# Patient Record
Sex: Female | Born: 1960
Health system: Southern US, Community
[De-identification: ages and names within clinical notes are randomized; demographics above are authoritative.]

## PROBLEM LIST (undated history)

## (undated) DIAGNOSIS — I10 Essential (primary) hypertension: Secondary | ICD-10-CM

## (undated) DIAGNOSIS — O009 Unspecified ectopic pregnancy without intrauterine pregnancy: Secondary | ICD-10-CM

## (undated) DIAGNOSIS — J45909 Unspecified asthma, uncomplicated: Secondary | ICD-10-CM

## (undated) DIAGNOSIS — K219 Gastro-esophageal reflux disease without esophagitis: Secondary | ICD-10-CM

## (undated) HISTORY — PX: SALPINGECTOMY: SHX328

## (undated) HISTORY — PX: ECTOPIC PREGNANCY SURGERY: SHX613

---

## 1998-11-11 DIAGNOSIS — D259 Leiomyoma of uterus, unspecified: Secondary | ICD-10-CM | POA: Insufficient documentation

## 2002-08-20 ENCOUNTER — Encounter (INDEPENDENT_AMBULATORY_CARE_PROVIDER_SITE_OTHER): Payer: Self-pay | Admitting: Internal Medicine

## 2007-02-17 ENCOUNTER — Emergency Department (HOSPITAL_COMMUNITY): Admission: EM | Admit: 2007-02-17 | Discharge: 2007-02-17 | Payer: Self-pay | Admitting: Emergency Medicine

## 2008-03-30 ENCOUNTER — Ambulatory Visit (HOSPITAL_COMMUNITY): Admission: RE | Admit: 2008-03-30 | Discharge: 2008-03-30 | Payer: Self-pay | Admitting: Obstetrics & Gynecology

## 2009-02-07 DIAGNOSIS — R87610 Atypical squamous cells of undetermined significance on cytologic smear of cervix (ASC-US): Secondary | ICD-10-CM | POA: Insufficient documentation

## 2009-06-06 ENCOUNTER — Ambulatory Visit (HOSPITAL_COMMUNITY): Admission: RE | Admit: 2009-06-06 | Discharge: 2009-06-06 | Payer: Self-pay | Admitting: Family Medicine

## 2009-11-21 ENCOUNTER — Emergency Department (HOSPITAL_COMMUNITY): Admission: EM | Admit: 2009-11-21 | Discharge: 2009-11-21 | Payer: Self-pay | Admitting: Emergency Medicine

## 2010-02-17 ENCOUNTER — Ambulatory Visit: Payer: Self-pay | Admitting: Internal Medicine

## 2010-02-17 DIAGNOSIS — J45909 Unspecified asthma, uncomplicated: Secondary | ICD-10-CM | POA: Insufficient documentation

## 2010-02-17 DIAGNOSIS — I8390 Asymptomatic varicose veins of unspecified lower extremity: Secondary | ICD-10-CM | POA: Insufficient documentation

## 2010-02-17 LAB — CONVERTED CEMR LAB
ALT: 11 units/L (ref 0–35)
Albumin: 4.5 g/dL (ref 3.5–5.2)
Basophils Absolute: 0 10*3/uL (ref 0.0–0.1)
Basophils Relative: 0 % (ref 0–1)
Calcium: 9.6 mg/dL (ref 8.4–10.5)
Chloride: 105 meq/L (ref 96–112)
Cholesterol: 184 mg/dL (ref 0–200)
Eosinophils Absolute: 0.1 10*3/uL (ref 0.0–0.7)
Eosinophils Relative: 3 % (ref 0–5)
Glucose, Bld: 80 mg/dL (ref 70–99)
Lymphocytes Relative: 43 % (ref 12–46)
Lymphs Abs: 2.2 10*3/uL (ref 0.7–4.0)
MCHC: 34.5 g/dL (ref 30.0–36.0)
Monocytes Relative: 6 % (ref 3–12)
Neutrophils Relative %: 48 % (ref 43–77)
Potassium: 4.4 meq/L (ref 3.5–5.3)
RBC: 4.4 M/uL (ref 3.87–5.11)
RDW: 12.5 % (ref 11.5–15.5)
Sodium: 140 meq/L (ref 135–145)
WBC: 5.2 10*3/uL (ref 4.0–10.5)

## 2010-02-21 ENCOUNTER — Encounter: Payer: Self-pay | Admitting: Physician Assistant

## 2010-03-02 ENCOUNTER — Encounter (INDEPENDENT_AMBULATORY_CARE_PROVIDER_SITE_OTHER): Payer: Self-pay | Admitting: Internal Medicine

## 2010-03-08 ENCOUNTER — Encounter (INDEPENDENT_AMBULATORY_CARE_PROVIDER_SITE_OTHER): Payer: Self-pay | Admitting: Internal Medicine

## 2010-07-07 ENCOUNTER — Ambulatory Visit
Admission: RE | Admit: 2010-07-07 | Discharge: 2010-07-07 | Payer: Self-pay | Source: Home / Self Care | Attending: Internal Medicine | Admitting: Internal Medicine

## 2010-07-07 ENCOUNTER — Encounter (INDEPENDENT_AMBULATORY_CARE_PROVIDER_SITE_OTHER): Payer: Self-pay | Admitting: Internal Medicine

## 2010-07-07 ENCOUNTER — Other Ambulatory Visit: Payer: Self-pay | Admitting: Internal Medicine

## 2010-07-07 ENCOUNTER — Other Ambulatory Visit (HOSPITAL_COMMUNITY): Payer: Self-pay | Admitting: Internal Medicine

## 2010-07-07 DIAGNOSIS — G4700 Insomnia, unspecified: Secondary | ICD-10-CM | POA: Insufficient documentation

## 2010-07-07 DIAGNOSIS — Z139 Encounter for screening, unspecified: Secondary | ICD-10-CM

## 2010-07-07 DIAGNOSIS — Z1231 Encounter for screening mammogram for malignant neoplasm of breast: Secondary | ICD-10-CM

## 2010-07-07 LAB — CONVERTED CEMR LAB
Chlamydia, DNA Probe: NEGATIVE
Glucose, Urine, Semiquant: NEGATIVE
KOH Prep: NEGATIVE
Specific Gravity, Urine: 1.025
Urobilinogen, UA: 0.2
pH: 5.5

## 2010-07-11 ENCOUNTER — Ambulatory Visit (HOSPITAL_COMMUNITY): Admission: RE | Admit: 2010-07-11 | Payer: Self-pay | Source: Home / Self Care | Admitting: Internal Medicine

## 2010-07-11 NOTE — Letter (Signed)
Summary: Lipid Letter  [facility]  [address] [place], [place] [address]   Phone: [phone]  Fax: [phone]    03/08/2010  Brianna Jenkins [address] [place], [place]  [address]  Dear Marton Redwood:  We have carefully reviewed your last lipid profile from 02/17/2010 and the results are noted below with a summary of recommendations for lipid management.    Cholesterol:       184     Goal: <200   HDL "good" Cholesterol:   60     Goal: >45   LDL "bad" Cholesterol:   102     Goal: <100   Triglycerides:       110     Goal: <150    Cholesterol and rest of labs were fine.    TLC Diet (Therapeutic Lifestyle Change): Saturated Fats & Transfatty acids should be kept < 7% of total calories ***Reduce Saturated Fats Polyunstaurated Fat can be up to 10% of total calories Monounsaturated Fat Fat can be up to 20% of total calories Total Fat should be no greater than 25-35% of total calories Carbohydrates should be 50-60% of total calories Protein should be approximately 15% of total calories Fiber should be at least 20-30 grams a day ***Increased fiber may help lower LDL Total Cholesterol should be < 200mg /day Consider adding plant stanol/sterols to diet (example: Benacol spread) ***A higher intake of unsaturated fat may reduce Triglycerides and Increase HDL    Adjunctive Measures (may lower LIPIDS and reduce risk of Heart Attack) include: Aerobic Exercise (20-30 minutes 3-4 times a week) Limit Alcohol Consumption Weight Reduction Aspirin 75-81 mg a day by mouth (if not allergic or contraindicated) Dietary Fiber 20-30 grams a day by mouth     Current Medications: 1)    Advair Diskus 250-50 Mcg/dose Aepb (Fluticasone-salmeterol) .Marland Kitchen.. 1 inhalation two times a day 2)    Ventolin Hfa 108 (90 Base) Mcg/act Aers (Albuterol sulfate) .... 2 puffs every 4 hours as needed for wheezing.  If you have any questions, please call. We appreciate  being able to work with you.   Sincerely,    [facility]

## 2010-07-11 NOTE — Assessment & Plan Note (Signed)
Summary: NEW/ESTABLISH CARE////RJE   Vital Signs:  Patient profile:   50 year old female Menstrual status:  regular LMP:     01/27/2010 Height:      67 inches Weight:      157 pounds BMI:     24.68 O2 Sat:      97 % on Room air Temp:     98.1 degrees F oral Pulse rate:   92 / minute Pulse rhythm:   regular Resp:     20 per minute BP sitting:   122 / 84  (left arm) Cuff size:   regular  Vitals Entered By: Michelle Nasuti (February 17, 2010 11:04 AM)  O2 Flow:  Room air  Serial Vital Signs/Assessments:  Comments: peak flows 1) 240 2) 250 3) 260 By: Michelle Nasuti   CC: establish care Pain Assessment Patient in pain? no       Does patient need assistance? Functional Status Self care Ambulation Normal LMP (date): 01/27/2010     Menstrual Status regular Enter LMP: 01/27/2010   CC:  establish care.  History of Present Illness: 50 yo female here to establish.  Really just wants to get established with a primary.    Concerns:  1. Varicose veins:  Lot in legs--getting larger and at times painful.  Has never worn compression stockings.  Not working and not on feet excessively.  Occasionally has leg swelling with this.  2.  Hx of abnormal pap 1 year ago through [facility] screen.  HPV was negative.  When was in 20's  had an abnormal pap.  Biopsy was normal.  Paps ultimately normalized as well.  3.  Asthma:  Diagnosed in early 16s. Currently treated with Advair 250/50 only as needed, then uses the Albuterol if does not get the relief. This past week, using both meds every day.  Has problems generally when seasons change.    Habits & Providers  Alcohol-Tobacco-Diet     Tobacco Status: current     Cigarette Packs/Day: 0.5  Current Medications (verified): 1)  Ventolin Hfa 108 (90 Base) Mcg/act Aers (Albuterol Sulfate) 2)  Advair Diskus 250-50 Mcg/dose Aepb (Fluticasone-Salmeterol)  Allergies (verified): No Known Drug Allergies  Past History:  Past  Surgical History: 1.   1988, 1989, 1994: separate surgeries for ectopic pregnancies.  Only left salpingectomy for removal of her tissue.  Last was laparoscopic  Family History: Mother, 22:  DM, hypertension. Father, 67:  unknown. 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  Currently 1ppd at most Alcohol:  Occasional Drug use:  never.Smoking Status:  current Packs/Day:  0.5  Physical Exam  Eyes:  No injection or watering Mouth:  pharynx pink and moist.   Neck:  No deformities, masses, or tenderness noted.  No thyroid enlargement--just excess tissue anterior to neck Lungs:  Rare wheeze with good air movement. Heart:  Normal rate and regular rhythm. S1 and S2 normal without gallop, murmur, click, rub or other extra sounds.  Radial pulses normal and equal Extremities:  Pt. could not get pant leg up to visualize and would like to wait until seen for CPP   Impression & Recommendations:  Problem # 1:  VARICOSE VEINS, LOWER EXTREMITIES (ICD-454.9) Elevation, compression stockings, avoid standing in place Orders: T-Comprehensive Metabolic Panel ([id number]) T-Lipid Profile (  [phone]) T-CBC w/Diff [phone])  Problem # 2:  ASTHMA (ICD-493.90) Discussed proper use of Advair on regular basis to prevent wheezing. Encouraged flu shot when comes available. Her updated medication list for this problem includes:    Advair Diskus 250-50 Mcg/dose Aepb (Fluticasone-salmeterol) .Marland Kitchen... 1 inhalation two times a day    Ventolin Hfa 108 (90 Base) Mcg/act Aers (Albuterol sulfate) .Marland Kitchen... 2 puffs every 4 hours as needed for wheezing.  Orders: T-Comprehensive Metabolic Panel [id number]) T-Lipid Profile  [phone]) T-CBC w/Diff ([id number])  Problem # 3:  PAP SMEAR, ABNORMAL (ICD-795.00) CPP next available.  Complete Medication List: 1)  Advair Diskus 250-50 Mcg/dose Aepb (Fluticasone-salmeterol) .Marland Kitchen.. 1 inhalation two times a day 2)  Ventolin Hfa 108 (90 Base) Mcg/act Aers (Albuterol sulfate) .... 2 puffs every 4 hours as needed for wheezing.  Other Orders: T-TSH [phone])  Patient Instructions: 1)  CPP next available with Dr. Delrae Alfred Prescriptions: VENTOLIN HFA 108 (90 BASE) MCG/ACT AERS (ALBUTEROL SULFATE) 2 puffs every 4 hours as needed for wheezing.  #1 x 1   Entered and Authorized by:   Julieanne Manson MD   Signed by:   Julieanne Manson MD on 02/17/2010   Method used:   Faxed to ...       [facility] (retail)       [address] [place], [place]  [address]       Ph: [phone]       Fax: [phone]   RxID:   [id number] ADVAIR DISKUS 250-50 MCG/DOSE AEPB (FLUTICASONE-SALMETEROL) 1 inhalation two times a day  #1 x 11   Entered and Authorized by:   Julieanne Manson MD   Signed by:   Julieanne Manson MD on 02/17/2010   Method used:   Faxed to ...       [facility] (retail)       [address] [place], [place]  [address]       Ph: [phone]       Fax: [phone]   RxID:   [id number]

## 2010-07-11 NOTE — Letter (Signed)
Summary: PT INFORMATION SHEET  PT INFORMATION SHEET   Imported By: Arta Bruce 02/17/2010 12:43:59  _____________________________________________________________________  External Attachment:    Type:   Image     Comment:   External Document

## 2010-07-11 NOTE — Letter (Signed)
Summary: REQUESTING RECORDS FROM [facility]  REQUESTING RECORDS FROM TINA DOBSERGAVE   Imported By: Arta Bruce 02/22/2010 12:44:24  _____________________________________________________________________  External Attachment:    Type:   Image     Comment:   External Document

## 2010-07-14 ENCOUNTER — Inpatient Hospital Stay (HOSPITAL_COMMUNITY): Admission: RE | Admit: 2010-07-14 | Payer: Self-pay | Source: Ambulatory Visit

## 2010-07-15 ENCOUNTER — Encounter: Payer: Self-pay | Admitting: Internal Medicine

## 2010-07-22 ENCOUNTER — Telehealth (INDEPENDENT_AMBULATORY_CARE_PROVIDER_SITE_OTHER): Payer: Self-pay | Admitting: Internal Medicine

## 2010-07-22 ENCOUNTER — Encounter (INDEPENDENT_AMBULATORY_CARE_PROVIDER_SITE_OTHER): Payer: Self-pay | Admitting: Internal Medicine

## 2010-07-25 ENCOUNTER — Encounter (INDEPENDENT_AMBULATORY_CARE_PROVIDER_SITE_OTHER): Payer: Self-pay | Admitting: Internal Medicine

## 2010-07-27 NOTE — Letter (Signed)
Summary: *HSN Results Follow up  [facility]  [address] [place], [place] [address]   Phone: [phone]  Fax: [phone]      07/22/2010   Brianna Jenkins [address] [place], [place]  [address]   Dear  Ms. [employer],                            ____S.Drinkard,FNP   ____D. Gore,FNP       ____B. McPherson,MD   ____V. Rankins,MD    __X__E. Derrica Sieg,MD    ____N. Daphine Deutscher, FNP  ____D. Reche Dixon, MD    ____K. Philipp Deputy, MD    ____Other     This letter is to inform you that your recent test(s):  ____X___Pap Smear    ___X___Lab Test     _______X-ray    ____X_ is within acceptable limits  _______ requires a medication change  _______ requires a follow-up lab visit  _______ requires a follow-up visit with your provider   Comments:  Your pap and STD testing was fine.  I am calling the pathology lab to get a copy of your previous pap to be sure this is all the follow up that should be done.       _________________________________________________________ If you have any questions, please contact our office                     Sincerely,  Julieanne Manson MD [facility]

## 2010-08-08 ENCOUNTER — Ambulatory Visit (HOSPITAL_COMMUNITY): Payer: Self-pay | Attending: Internal Medicine

## 2010-08-08 NOTE — Letter (Signed)
Summary: PATHOLOGY  PATHOLOGY   Imported By: Arta Bruce 08/03/2010 13:42:52  _____________________________________________________________________  External Attachment:    Type:   Image     Comment:   External Document

## 2010-08-08 NOTE — Assessment & Plan Note (Signed)
Summary: FU FOR CPP///KT   Vital Signs:  Patient profile:   50 year old female Menstrual status:  regular LMP:     07/03/2010 Height:      67 inches Weight:      158.06 pounds O2 Sat:      96 % on Room air Temp:     97 degrees F oral Pulse rate:   90 / minute Pulse rhythm:   regular Resp:     18 per minute BP sitting:   110 / 78  (left arm) Cuff size:   regular  Vitals Entered By: Hale Drone CMA (July 07, 2010 12:00 PM)  O2 Flow:  Room air CC: 50 y/o CPP. Not sleeping well x2 weeks. Also has a cough x2 weeks. Couging up mucous yellowish. Throat begining to get sore.  Is Patient Diabetic? No Pain Assessment Patient in pain? no       Does patient need assistance? Functional Status Self care Ambulation Normal LMP (date): 07/03/2010     Enter LMP: 07/03/2010   Serial Vital Signs/Assessments:                                PEF    PreRx  PostRx Time      O2 Sat  O2 Type     L/min  L/min  L/min   By 12:04 PM  96  %   Room air                          [facility] CMA  Comments: 12:04 PM Peak Flow: 280 - 280 - 260 By: Hale Drone CMA    CC:  50 y/o CPP. Not sleeping well x2 weeks. Also has a cough x2 weeks. Couging up mucous yellowish. Throat begining to get sore. Marland Kitchen  History of Present Illness: 50 yo female here for CPP.  Current Medications (verified): 1)  Advair Diskus 250-50 Mcg/dose Aepb (Fluticasone-Salmeterol) .Marland Kitchen.. 1 Inhalation Two Times A Day 2)  Ventolin Hfa 108 (90 Base) Mcg/act Aers (Albuterol Sulfate) .... 2 Puffs Every 4 Hours As Needed For Wheezing.  Allergies (verified): No Known Drug Allergies  Past History:  Past Medical History: Family Hx of HYPERTHYROIDISM, HX OF (ICD-V12.2) HEALTH SCREENING (ICD-V70.0) VARICOSE VEINS, LOWER EXTREMITIES (ICD-454.9) ASTHMA (ICD-493.90) PAP SMEAR, ABNORMAL (ICD-795.00)    Past Surgical History: Reviewed history from 02/17/2010 and no changes required. 1.   1988, 1989, 1994: separate surgeries for  ectopic pregnancies.  Only left salpingectomy for removal of her tissue.  Last was laparoscopic  Family History: Reviewed history from 02/17/2010 and no changes required. Mother, 60:  DM, hypertension. Father, 31:  unknown. Sister, 25:  Healthy, thyroid illness No children--had one pregnancy-lost at 8 months--result of GDM, 3 ectopic pregnancies subsequently  Social History: Reviewed history from 02/17/2010 and no changes required. 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  Currently 1ppd at most Alcohol:  Occasional Drug use:  never.  Review of Systems General:  Fatigued in past 2 weeks.  Not sleeping well for 2 weeks.  Lots on her mind and unable to initiate sleep.  . Eyes:  Denies blurring; Bifocals. ENT:  Denies decreased hearing. CV:  Denies chest pain or discomfort and palpitations. Resp:  Using rescue inhaler infrequently--has not needed even every week.Marland Kitchen GI:  Complains of constipation;  denies bloody stools and dark tarry stools. GU:  Denies discharge; Periods regular Night sweats.. MS:  Denies joint pain, joint redness, and joint swelling. Derm:  Denies lesion(s) and rash. Neuro:  Denies tingling and weakness; Numbness in legs when sleeping.  Goes away when gets up and moves around.Marland Kitchen Psych:  Denies suicidal thoughts/plans; Struggles with not having a job.  Out of work for 2 years.  Not interested in counseling.  Marland Kitchen  Physical Exam  General:  Well-developed,well-nourished,in no acute distress; alert,appropriate and cooperative throughout examination Head:  Normocephalic and atraumatic without obvious abnormalities. No apparent alopecia or balding. Eyes:  No corneal or conjunctival inflammation noted. EOMI. Perrla. Funduscopic exam benign, without hemorrhages, exudates or papilledema. Vision grossly normal. Ears:  External ear exam shows no significant  lesions or deformities.  Otoscopic examination reveals clear canals, tympanic membranes are intact bilaterally without bulging, retraction, inflammation or discharge. Hearing is grossly normal bilaterally. Nose:  External nasal examination shows no deformity or inflammation. Nasal mucosa are pink and moist without lesions or exudates. Mouth:  Oral mucosa and oropharynx without lesions or exudates.  Teeth in good repair. Neck:  No deformities, masses, or tenderness noted. Breasts:  No mass, nodules, thickening, tenderness, bulging, retraction, inflamation, nipple discharge or skin changes noted.   Lungs:  Normal respiratory effort, chest expands symmetrically. Lungs are clear to auscultation, no crackles or wheezes. Heart:  Normal rate and regular rhythm. S1 and S2 normal without gallop, murmur, click, rub or other extra sounds. Abdomen:  Bowel sounds positive,abdomen soft and non-tender without masses, organomegaly or hernias noted. Rectal:  No external abnormalities noted. Normal sphincter tone. No rectal masses or tenderness.  Heme negative light brown stool. Genitalia:  Pelvic Exam:        External: normal female genitalia without lesions or masses        Vagina: normal without lesions or masses        Cervix: normal without lesions or masses        Adnexa: normal bimanual exam without masses or fullness        Uterus: normal by palpation        Pap smear: performed Msk:  No deformity or scoliosis noted of thoracic or lumbar spine.   Pulses:  R and L carotid,radial,femoral,dorsalis pedis and posterior tibial pulses are full and equal bilaterally Extremities:  No clubbing, cyanosis, edema, or deformity noted with normal full range of motion of all joints.  Varicosities of bilateral LE Neurologic:  No cranial nerve deficits noted. Station and gait are normal. Plantar reflexes are down-going bilaterally. DTRs are symmetrical throughout. Sensory, motor and coordinative functions appear  intact. Skin:  Intact without suspicious lesions or rashes Cervical Nodes:  No lymphadenopathy noted Axillary Nodes:  No palpable lymphadenopathy Inguinal Nodes:  No significant adenopathy Psych:  Cognition and judgment appear intact. Alert and cooperative with normal attention span and concentration. No apparent delusions, illusions, hallucinations   Impression & Recommendations:  Problem # 1:  ROUTINE GYNECOLOGICAL EXAMINATION (ICD-V72.31) Encouraged Calcium/Vitamin D regularly and exercise Orders: KOH/ WET Mount [phone]) Pap Smear, Thin Prep ( Collection of) (Q0091)Hx of ASCUS with negative high risk HPV in 2011. UA Dipstick w/o Micro (automated)  (81003) T- GC Chlamydia (60454) T-Pap Smear, Thin Prep (09811)  Problem # 2:  HEALTH SCREENING (ICD-V70.0) Guaiac cards X 3 to return in 2 weeks Tdap and flu vaccines today.  Problem # 3:  ASTHMA (ICD-493.90) To stop smoking. Wants to try Chantix. Her updated medication list for  this problem includes:    Advair Diskus 250-50 Mcg/dose Aepb (Fluticasone-salmeterol) .Marland Kitchen... 1 inhalation two times a day    Ventolin Hfa 108 (90 Base) Mcg/act Aers (Albuterol sulfate) .Marland Kitchen... 2 puffs every 4 hours as needed for wheezing.  Orders: Peak Flow Rate (94150) Pulse Oximetry (single measurment) (94760)  Problem # 4:  INSOMNIA (ICD-780.52) Discussed good sleep hygiene  One time Rx of Lorazepam to interrupt sleepless nights--will not refill.  Complete Medication List: 1)  Advair Diskus 250-50 Mcg/dose Aepb (Fluticasone-salmeterol) .Marland Kitchen.. 1 inhalation two times a day 2)  Ventolin Hfa 108 (90 Base) Mcg/act Aers (Albuterol sulfate) .... 2 puffs every 4 hours as needed for wheezing. 3)  Lorazepam 1 Mg Tabs (Lorazepam) .Marland Kitchen.. 1 tab by mouth as needed sleep 4)  Chantix 0.5 Mg Tabs (Varenicline tartrate) .... 0.5 mg by mouth daily for 3 days, then increase to two times a day.  on day 8, increase to 1.0 mg by mouth two times a day 5)  Chantix 1 Mg Tabs  (Varenicline tartrate) .Marland Kitchen.. 1 tab by mouth two times a day starting on day 8.  stop smoking on this day as well.  Other Orders: Tdap => 76yrs IM (04540) Admin 1st Vaccine (98119) Flu Vaccine 86yrs + (14782) Admin of Any Addtl Vaccine (95621)  Patient Instructions: 1)  Citrated calcium 500-600 mg with 200 International Units of Vitamin D:     1 tab by mouth two times a day  2)  Follow up with Dr. Delrae Alfred in 2 months --for smoking cessation Prescriptions: CHANTIX 1 MG TABS (VARENICLINE TARTRATE) 1 tab by mouth two times a day starting on Day 8.  Stop smoking on this day as well.  #60 x 2   Entered and Authorized by:   Julieanne Manson MD   Signed by:   Julieanne Manson MD on 08/02/2010   Method used:   Faxed to ...       [facility] (retail)       [address] [place], [place]  [address]       Ph: [phone]       Fax: [phone]   RxID:   [id number] CHANTIX 0.5 MG TABS (VARENICLINE TARTRATE) 0.5 mg by mouth daily for 3 days, then increase to two times a day.  On day 8, increase to 1.0 mg by mouth two times a day  #11 x 0   Entered and Authorized by:   Julieanne Manson MD   Signed by:   Julieanne Manson MD on 08/02/2010   Method used:   Faxed to ...       [facility] (retail)       [address] [place], [place]  [address]       Ph: [phone]       Fax: [phone]   RxID:   [id number] LORAZEPAM 1 MG TABS (LORAZEPAM) 1 tab by mouth as needed sleep  #2 x 0   Entered and Authorized by:   Julieanne Manson MD   Signed by:   Julieanne Manson MD on 07/07/2010   Method used:   Print then Give to Patient   RxID:   [id number]    Orders Added: 1)  Peak Flow Rate [94150] 2)  Pulse Oximetry (single measurment) [94760] 3)  Tdap => 4yrs IM [90715] 4)  Admin 1st Vaccine [90471] 5)  Flu Vaccine 60yrs + [60737] 6)  Admin of Any Addtl Vaccine [90472] 7)  Est.  Patient age 17-64 [81] 8)  KOH/ WET Mount [phone] 9)  Pap Smear, Thin Prep ( Collection of) [Q0091] 10)  UA Dipstick w/o Micro (automated)  [81003] 11)  T- GC Chlamydia [62952] 12)  T-Pap Smear, Thin Prep [84132]   Immunizations Administered:  Tetanus Vaccine:    Vaccine Type: Tdap    Site: left deltoid    Mfr: Sanofi Pasteur    Dose: 0.5 ml    Route: IM    Given by: Hale Drone CMA    Exp. Date: 11/02/2012    Lot #: [id number]    VIS given: 04/28/08 version given July 07, 2010.  Influenza Vaccine # 1:    Vaccine Type: Fluvax 3+    Site: left deltoid    Mfr: GlaxoSmithKline    Dose: 0.5 ml    Route: IM    Given by: Hale Drone CMA    Exp. Date: 12/09/2010    Lot #: [id number]    VIS given: 01/03/10 version given July 07, 2010.  Flu Vaccine Consent Questions:    Do you have a history of severe allergic reactions to this vaccine? no    Any prior history of allergic reactions to egg and/or gelatin? no    Do you have a sensitivity to the preservative Thimersol? no    Do you have a past history of Guillan-Barre Syndrome? no    Do you currently have an acute febrile illness? no    Have you ever had a severe reaction to latex? no    Vaccine information given and explained to patient? yes    Are you currently pregnant? no   Immunizations Administered:  Tetanus Vaccine:    Vaccine Type: Tdap    Site: left deltoid    Mfr: Sanofi Pasteur    Dose: 0.5 ml    Route: IM    Given by: Hale Drone CMA    Exp. Date: 11/02/2012    Lot #: [id number]    VIS given: 04/28/08 version given July 07, 2010.  Influenza Vaccine # 1:    Vaccine Type: Fluvax 3+    Site: left deltoid    Mfr: GlaxoSmithKline    Dose: 0.5 ml    Route: IM    Given by: Hale Drone CMA    Exp. Date: 12/09/2010    Lot #: [id number]    VIS given: 01/03/10 version given July 07, 2010.    Preventive Care Screening  Prior Values:    Mammogram:  ASSESSMENT: Negative - BI-RADS 1^MS DIGITAL SCREENING  (03/30/2008)     Mammogram:  has never had an abnormal mammogram--already scheduled for followup  Pap:  Hx of ASCUS 01/2009. HPV was negative.  Was told just needed a follow up in 1 year.  Hx when in 20s of abnormal pap that ultimately normalized without treatment.  Biopsy was benign Immunizations:  Has not had a tetanus in over 10 years.  Does not want a flu vaccine. Guaiac Cards:  never Colonoscopy:  never Osteoprevention:  Little to no dairy daily.  Does not take calcium or Vitamin D.  Was exercising since before Christmas--planning to restart.  Laboratory Results   Urine Tests  Date/Time Received: July 07, 2010 12:40 PM   Routine Urinalysis   Color: lt. yellow Appearance: Clear Glucose: negative   (Normal Range: Negative) Bilirubin: negative   (Normal Range: Negative) Ketone: negative   (Normal Range: Negative) Spec. Gravity: 1.025   (Normal Range: 1.003-1.035)  Blood: trace-intact   (Normal Range: Negative) pH: 5.5   (Normal Range: 5.0-8.0) Protein: negative   (Normal Range: Negative) Urobilinogen: 0.2   (Normal Range: 0-1) Nitrite: negative   (Normal Range: Negative) Leukocyte Esterace: trace   (Normal Range: Negative)      Wet Mount Source: vaginal WBC/hpf: 1-5 Bacteria/hpf: 1+ Clue cells/hpf: none  Negative whiff Yeast/hpf: none Wet Mount KOH: Negative Trichomonas/hpf: none      Laboratory Results   Urine Tests    Routine Urinalysis   Color: lt. yellow Appearance: Clear Glucose: negative   (Normal Range: Negative) Bilirubin: negative   (Normal Range: Negative) Ketone: negative   (Normal Range: Negative) Spec. Gravity: 1.025   (Normal Range: 1.003-1.035) Blood: trace-intact   (Normal Range: Negative) pH: 5.5   (Normal Range: 5.0-8.0) Protein: negative   (Normal Range: Negative) Urobilinogen: 0.2   (Normal Range: 0-1) Nitrite: negative   (Normal Range: Negative) Leukocyte Esterace: trace   (Normal Range: Negative)      Wet Mount/KOH   Negative whiff

## 2010-08-08 NOTE — Progress Notes (Signed)
Summary: Previous Paps  Phone Note Outgoing Call   Summary of Call: Ramon--please call pathology that does paps--see her pap report for where to call--see if can get last pap report for me.  thanks Initial call taken by: Julieanne Manson MD,  July 22, 2010 5:09 PM  Follow-up for Phone Call        Last paps were done [facility]. Results in your bookcase. Gaylyn Cheers RN  July 25, 2010 12:52 PM

## 2010-08-09 ENCOUNTER — Encounter (INDEPENDENT_AMBULATORY_CARE_PROVIDER_SITE_OTHER): Payer: Self-pay | Admitting: Internal Medicine

## 2010-08-17 NOTE — Miscellaneous (Signed)
Summary: old records review  Clinical Lists Changes  Problems: Added new problem of History of  FIBROIDS, UTERUS (ICD-218.9) - Ultrasound--records review

## 2010-08-17 NOTE — Progress Notes (Signed)
Summary: Office Visit//DEPRESSION SCREENING  Office Visit//DEPRESSION SCREENING   Imported By: Arta Bruce 08/08/2010 15:41:47  _____________________________________________________________________  External Attachment:    Type:   Image     Comment:   External Document

## 2010-08-17 NOTE — Letter (Signed)
Summary: RECEIVED RECORSD FROM [facility]  RECEIVED RECORSD FROM [facility]   Imported By: Arta Bruce 08/10/2010 14:08:52  _____________________________________________________________________  External Attachment:    Type:   Image     Comment:   External Document

## 2011-03-26 ENCOUNTER — Ambulatory Visit (HOSPITAL_COMMUNITY): Payer: Self-pay | Attending: Internal Medicine

## 2011-04-13 ENCOUNTER — Ambulatory Visit (HOSPITAL_COMMUNITY)
Admission: RE | Admit: 2011-04-13 | Discharge: 2011-04-13 | Disposition: A | Discharge status: Home / Self Care | Payer: Self-pay | Source: Ambulatory Visit | Attending: Internal Medicine | Admitting: Internal Medicine

## 2011-04-13 DIAGNOSIS — Z1231 Encounter for screening mammogram for malignant neoplasm of breast: Secondary | ICD-10-CM | POA: Insufficient documentation

## 2011-07-03 ENCOUNTER — Other Ambulatory Visit: Payer: Self-pay | Admitting: Internal Medicine

## 2013-05-13 ENCOUNTER — Encounter (HOSPITAL_COMMUNITY): Payer: Self-pay | Admitting: Emergency Medicine

## 2013-05-13 ENCOUNTER — Emergency Department (HOSPITAL_COMMUNITY)
Admission: EM | Admit: 2013-05-13 | Discharge: 2013-05-13 | Disposition: A | Discharge status: Home / Self Care | Payer: Medicaid - Out of State | Attending: Emergency Medicine | Admitting: Emergency Medicine

## 2013-05-13 ENCOUNTER — Emergency Department (HOSPITAL_COMMUNITY): Payer: Medicaid - Out of State

## 2013-05-13 DIAGNOSIS — Z79899 Other long term (current) drug therapy: Secondary | ICD-10-CM | POA: Insufficient documentation

## 2013-05-13 DIAGNOSIS — E876 Hypokalemia: Secondary | ICD-10-CM

## 2013-05-13 DIAGNOSIS — J45901 Unspecified asthma with (acute) exacerbation: Secondary | ICD-10-CM | POA: Insufficient documentation

## 2013-05-13 DIAGNOSIS — R197 Diarrhea, unspecified: Secondary | ICD-10-CM

## 2013-05-13 DIAGNOSIS — J40 Bronchitis, not specified as acute or chronic: Secondary | ICD-10-CM

## 2013-05-13 DIAGNOSIS — R079 Chest pain, unspecified: Secondary | ICD-10-CM | POA: Insufficient documentation

## 2013-05-13 DIAGNOSIS — R6883 Chills (without fever): Secondary | ICD-10-CM | POA: Insufficient documentation

## 2013-05-13 DIAGNOSIS — IMO0002 Reserved for concepts with insufficient information to code with codable children: Secondary | ICD-10-CM | POA: Insufficient documentation

## 2013-05-13 HISTORY — DX: Unspecified asthma, uncomplicated: J45.909

## 2013-05-13 LAB — BASIC METABOLIC PANEL
Calcium: 8.9 mg/dL (ref 8.4–10.5)
Chloride: 103 mEq/L (ref 96–112)
Creatinine, Ser: 0.65 mg/dL (ref 0.50–1.10)
GFR calc non Af Amer: >90 mL/min (ref >90)
Glucose, Bld: 102 mg/dL — ABNORMAL HIGH (ref 70–99)
Sodium: 139 mEq/L (ref 135–145)

## 2013-05-13 LAB — CBC
HCT: 39.7 % (ref 36.0–46.0)
Hemoglobin: 14.2 g/dL (ref 12.0–15.0)
MCHC: 35.8 g/dL (ref 30.0–36.0)
MCV: 96.1 fL (ref 78.0–100.0)
Platelets: 236 10*3/uL (ref 150–400)
RBC: 4.13 MIL/uL (ref 3.87–5.11)
RDW: 12.6 % (ref 11.5–15.5)

## 2013-05-13 LAB — POCT I-STAT TROPONIN I: Troponin i, poc: 0.01 ng/mL (ref 0.00–0.08)

## 2013-05-13 MED ORDER — FAMOTIDINE 20 MG PO TABS: 20.0000 mg | ORAL_TABLET | Freq: Two times a day (BID) | ORAL | Status: DC

## 2013-05-13 MED ORDER — ACETAMINOPHEN-CODEINE #3 300-30 MG PO TABS: 1.0000 | ORAL_TABLET | Freq: Four times a day (QID) | ORAL | Status: DC | PRN

## 2013-05-13 MED ORDER — ALBUTEROL SULFATE HFA 108 (90 BASE) MCG/ACT IN AERS
1.0000 | INHALATION_SPRAY | Freq: Four times a day (QID) | RESPIRATORY_TRACT | Status: DC | PRN
  Administered 2013-05-13: 2 via RESPIRATORY_TRACT
  Filled 2013-05-13: qty 6.7

## 2013-05-13 MED ORDER — ALBUTEROL SULFATE (5 MG/ML) 0.5% IN NEBU
5.0000 mg | INHALATION_SOLUTION | Freq: Once | RESPIRATORY_TRACT | Status: AC
  Administered 2013-05-13: 5 mg via RESPIRATORY_TRACT
  Filled 2013-05-13: qty 1

## 2013-05-13 MED ORDER — FLUTICASONE PROPIONATE HFA 110 MCG/ACT IN AERO
1.0000 | INHALATION_SPRAY | Freq: Two times a day (BID) | RESPIRATORY_TRACT | Status: DC
  Administered 2013-05-13: 1 via RESPIRATORY_TRACT
  Filled 2013-05-13: qty 12

## 2013-05-13 MED ORDER — IPRATROPIUM BROMIDE 0.02 % IN SOLN
0.5000 mg | Freq: Once | RESPIRATORY_TRACT | Status: AC
  Administered 2013-05-13: 0.5 mg via RESPIRATORY_TRACT
  Filled 2013-05-13: qty 2.5

## 2013-05-13 MED ORDER — METHYLPREDNISOLONE SODIUM SUCC 125 MG IJ SOLR
125.0000 mg | Freq: Once | INTRAMUSCULAR | Status: AC
  Administered 2013-05-13: 125 mg via INTRAVENOUS
  Filled 2013-05-13: qty 2

## 2013-05-13 MED ORDER — PREDNISONE 20 MG PO TABS: 40.0000 mg | ORAL_TABLET | Freq: Every day | ORAL | Status: DC

## 2013-05-13 MED ORDER — POTASSIUM CHLORIDE IN NACL 20-0.9 MEQ/L-% IV SOLN
Freq: Once | INTRAVENOUS | Status: AC
  Administered 2013-05-13: 150 mL/h via INTRAVENOUS
  Filled 2013-05-13 (×2): qty 1000

## 2013-05-13 MED ORDER — LOPERAMIDE HCL 2 MG PO CAPS
4.0000 mg | ORAL_CAPSULE | Freq: Once | ORAL | Status: AC
  Administered 2013-05-13: 4 mg via ORAL
  Filled 2013-05-13: qty 2

## 2013-05-13 NOTE — Discharge Instructions (Signed)
 Bronchitis Bronchitis is the body's way of reacting to injury and/or infection (inflammation) of the bronchi. Bronchi are the air tubes that extend from the windpipe into the lungs. If the inflammation becomes severe, it may cause shortness of breath. CAUSES  Inflammation may be caused by:  A virus.  Germs (bacteria).  Dust.  Allergens.  Pollutants and many other irritants. The cells lining the bronchial tree are covered with tiny hairs (cilia). These constantly beat upward, away from the lungs, toward the mouth. This keeps the lungs free of pollutants. When these cells become too irritated and are unable to do their job, mucus begins to develop. This causes the characteristic cough of bronchitis. The cough clears the lungs when the cilia are unable to do their job. Without either of these protective mechanisms, the mucus would settle in the lungs. Then you would develop pneumonia. Smoking is a common cause of bronchitis and can contribute to pneumonia. Stopping this habit is the single most important thing you can do to help yourself. TREATMENT   Your caregiver may prescribe an antibiotic if the cough is caused by bacteria. Also, medicines that open up your airways make it easier to breathe. Your caregiver may also recommend or prescribe an expectorant. It will loosen the mucus to be coughed up. Only take over-the-counter or prescription medicines for pain, discomfort, or fever as directed by your caregiver.  Removing whatever causes the problem (smoking, for example) is critical to preventing the problem from getting worse.  Cough suppressants may be prescribed for relief of cough symptoms.  Inhaled medicines may be prescribed to help with symptoms now and to help prevent problems from returning.  For those with recurrent (chronic) bronchitis, there may be a need for steroid medicines. SEEK IMMEDIATE MEDICAL CARE IF:   During treatment, you develop more pus-like mucus (purulent  sputum).  You have a fever.  You become progressively more ill.  You have increased difficulty breathing, wheezing, or shortness of breath. It is necessary to seek immediate medical care if you are elderly or sick from any other disease. MAKE SURE YOU:   Understand these instructions.  Will watch your condition.  Will get help right away if you are not doing well or get worse. Document Released: 05/28/2005 Document Revised: 01/28/2013 Document Reviewed: 01/20/2013 [facility] Patient Information 2014 [place], [place].     Diarrhea Diarrhea is frequent loose and watery bowel movements. It can cause you to feel weak and dehydrated. Dehydration can cause you to become tired and thirsty, have a dry mouth, and have decreased urination that often is dark yellow. Diarrhea is a sign of another problem, most often an infection that will not last long. In most cases, diarrhea typically lasts 2 3 days. However, it can last longer if it is a sign of something more serious. It is important to treat your diarrhea as directed by your caregive to lessen or prevent future episodes of diarrhea. CAUSES  Some common causes include:  Gastrointestinal infections caused by viruses, bacteria, or parasites.  Food poisoning or food allergies.  Certain medicines, such as antibiotics, chemotherapy, and laxatives.  Artificial sweeteners and fructose.  Digestive disorders. HOME CARE INSTRUCTIONS  Ensure adequate fluid intake (hydration): have 1 cup (8 oz) of fluid for each diarrhea episode. Avoid fluids that contain simple sugars or sports drinks, fruit juices, whole milk products, and sodas. Your urine should be clear or pale yellow if you are drinking enough fluids. Hydrate with an oral rehydration solution that you can  purchase at pharmacies, retail stores, and online. You can prepare an oral rehydration solution at home by mixing the following ingredients together:    tsp table salt.   tsp baking  soda.   tsp salt substitute containing potassium chloride .  1  tablespoons sugar.  1 L (34 oz) of water.  Certain foods and beverages may increase the speed at which food moves through the gastrointestinal (GI) tract. These foods and beverages should be avoided and include:  Caffeinated and alcoholic beverages.  High-fiber foods, such as raw fruits and vegetables, nuts, seeds, and whole grain breads and cereals.  Foods and beverages sweetened with sugar alcohols, such as xylitol, sorbitol, and mannitol.  Some foods may be well tolerated and may help thicken stool including:  Starchy foods, such as rice, toast, pasta, low-sugar cereal, oatmeal, grits, baked potatoes, crackers, and bagels.  Bananas.  Applesauce.  Add probiotic-rich foods to help increase healthy bacteria in the GI tract, such as yogurt and fermented milk products.  Wash your hands well after each diarrhea episode.  Only take over-the-counter or prescription medicines as directed by your caregiver.  Take a warm bath to relieve any burning or pain from frequent diarrhea episodes. SEEK IMMEDIATE MEDICAL CARE IF:   You are unable to keep fluids down.  You have persistent vomiting.  You have blood in your stool, or your stools are black and tarry.  You do not urinate in 6 8 hours, or there is only a small amount of very dark urine.  You have abdominal pain that increases or localizes.  You have weakness, dizziness, confusion, or lightheadedness.  You have a severe headache.  Your diarrhea gets worse or does not get better.  You have a fever or persistent symptoms for more than 2 3 days.  You have a fever and your symptoms suddenly get worse. MAKE SURE YOU:   Understand these instructions.  Will watch your condition.  Will get help right away if you are not doing well or get worse. Document Released: 05/18/2002 Document Revised: 05/14/2012 Document Reviewed: 02/03/2012 [facility] Patient Information  2014 [place], [place].

## 2013-05-13 NOTE — ED Notes (Signed)
Onset one week ago productive cough unsure of color with shortness of breath and exacerbation of her asthma. Went to work today felt increases shortness of breath and left sided chest pain intermittently 5/10 achy.  EMS gave albuterol 5mg  IV left hand 20g.

## 2013-05-13 NOTE — ED Provider Notes (Signed)
CSN: [id number]     Arrival date & time 05/13/13  1047 History   First MD Initiated Contact with Patient 05/13/13 1050     Chief Complaint  Patient presents with  . Asthma  . Cough  . Shortness of Breath   (Consider location/radiation/quality/duration/timing/severity/associated sxs/prior Treatment) Patient is a 52 y.o. female presenting with cough and diarrhea. The history is provided by the patient.  Cough Cough characteristics:  Productive Sputum characteristics:  Unable to specify Severity:  Moderate Onset quality:  Gradual Duration:  1 week Timing:  Constant Progression:  Worsening Chronicity:  New Smoker: no   Context: weather changes   Context: not smoke exposure   Ineffective treatments:  Beta-agonist inhaler Associated symptoms: chest pain and chills   Associated symptoms: no fever, no myalgias and no rash   Diarrhea Quality:  Semi-solid and watery Severity:  Moderate Onset quality:  Gradual Duration:  5 days Timing:  Intermittent Progression:  Unchanged Associated symptoms: chills and cough   Associated symptoms: no abdominal pain, no fever, no myalgias and no vomiting   Risk factors: no recent antibiotic use, no sick contacts, no suspicious food intake and no travel to endemic areas     Past Medical History  Diagnosis Date  . Asthma    History reviewed. No pertinent past surgical history. History reviewed. No pertinent family history. History  Substance Use Topics  . Smoking status: Not on file  . Smokeless tobacco: Not on file  . Alcohol Use: Not on file   OB History   Grav Para Term Preterm Abortions TAB SAB Ect Mult Living                 Review of Systems  Constitutional: Positive for chills. Negative for fever.  HENT: Positive for congestion.   Respiratory: Positive for cough.   Cardiovascular: Positive for chest pain.  Gastrointestinal: Positive for diarrhea. Negative for nausea, vomiting, abdominal pain and blood in stool.   Musculoskeletal: Negative for back pain, myalgias and neck stiffness.  Skin: Negative for rash.  Neurological: Negative for dizziness and syncope.  All other systems reviewed and are negative.    Allergies  Review of patient's allergies indicates no known allergies.  Home Medications   Current Outpatient Rx  Name  Route  Sig  Dispense  Refill  . albuterol (PROVENTIL HFA;VENTOLIN HFA) 108 (90 BASE) MCG/ACT inhaler   Inhalation   Inhale 2 puffs into the lungs every 6 (six) hours as needed for wheezing.         . fluticasone (FLOVENT HFA) 110 MCG/ACT inhaler   Inhalation   Inhale 2 puffs into the lungs 2 (two) times daily.         Marland Kitchen acetaminophen-codeine (TYLENOL #3) 300-30 MG per tablet   Oral   Take 1-2 tablets by mouth every 6 (six) hours as needed for moderate pain.   15 tablet   0   . famotidine (PEPCID) 20 MG tablet   Oral   Take 1 tablet (20 mg total) by mouth 2 (two) times daily.   30 tablet   0   . predniSONE (DELTASONE) 20 MG tablet   Oral   Take 2 tablets (40 mg total) by mouth daily.   12 tablet   0    BP 112/73  Pulse 82  Temp(Src) 98.5 F (36.9 C) (Oral)  Resp 20  SpO2 98% Physical Exam  Nursing note and vitals reviewed. Constitutional: She is oriented to person, place, and time. She appears well-developed  and well-nourished. No distress.  HENT:  Head: Normocephalic and atraumatic.  Mouth/Throat: Uvula is midline. Mucous membranes are not dry.  Eyes: Conjunctivae and EOM are normal.  Neck: Neck supple.  Cardiovascular: Normal rate, regular rhythm and intact distal pulses.   No murmur heard. Pulmonary/Chest: Effort normal. No respiratory distress. She has no wheezes. She has no rales.  Mild coughing  Abdominal: Soft. She exhibits no distension. There is no tenderness. There is no rebound.  Musculoskeletal: She exhibits no edema.  Neurological: She is alert and oriented to person, place, and time. She exhibits normal muscle tone.  Coordination normal.  Skin: Skin is warm and dry. She is not diaphoretic.  Psychiatric: She has a normal mood and affect.    ED Course  Procedures (including critical care time) Labs Review Labs Reviewed  BASIC METABOLIC PANEL - Abnormal; Notable for the following:    Potassium 3.1 (*)    Glucose, Bld 102 (*)    All other components within normal limits  CBC - Abnormal; Notable for the following:    MCH 34.4 (*)    All other components within normal limits  POCT I-STAT TROPONIN I   Imaging Review No results found.  EKG Interpretation    Date/Time:  Wednesday May 13 2013 10:56:30 EST Ventricular Rate:  82 PR Interval:  165 QRS Duration: 83 QT Interval:  389 QTC Calculation: 454 R Axis:   47 Text Interpretation:  Sinus rhythm Normal ECG No previous tracing Confirmed by Spurgeon Gancarz  MD, MICHEAL ([id number]) on 05/13/2013 11:30:42 AM            MDM   1. Bronchitis   2. Diarrhea   3. Hypokalemia      Pt appears well, VS normal, no fever.  ECG shows no ischemia.  CP is related most likely to cough and bronchitic changes.  Will treat as bronchitis, diarrhea without vomiting, minimally low K+ without ECG changes.  Related to diarrhea.  Pt improved after negs, steroids, will d/c home adn can f/u with PCP.  Inhalers provided and referral given to [facility].      Gavin Pound. Vivan Vanderveer, MD 05/16/13 1433

## 2013-11-20 ENCOUNTER — Emergency Department (HOSPITAL_COMMUNITY): Payer: PRIVATE HEALTH INSURANCE

## 2013-11-20 ENCOUNTER — Emergency Department (HOSPITAL_COMMUNITY)
Admission: EM | Admit: 2013-11-20 | Discharge: 2013-11-20 | Disposition: A | Discharge status: Home / Self Care | Payer: PRIVATE HEALTH INSURANCE | Attending: Emergency Medicine | Admitting: Emergency Medicine

## 2013-11-20 ENCOUNTER — Encounter (HOSPITAL_COMMUNITY): Payer: Self-pay | Admitting: Emergency Medicine

## 2013-11-20 DIAGNOSIS — Z79899 Other long term (current) drug therapy: Secondary | ICD-10-CM | POA: Insufficient documentation

## 2013-11-20 DIAGNOSIS — J45901 Unspecified asthma with (acute) exacerbation: Secondary | ICD-10-CM | POA: Insufficient documentation

## 2013-11-20 DIAGNOSIS — F17200 Nicotine dependence, unspecified, uncomplicated: Secondary | ICD-10-CM | POA: Insufficient documentation

## 2013-11-20 MED ORDER — ONDANSETRON HCL 4 MG/2ML IJ SOLN
4.0000 mg | Freq: Once | INTRAMUSCULAR | Status: AC
  Administered 2013-11-20: 4 mg via INTRAVENOUS
  Filled 2013-11-20: qty 2

## 2013-11-20 MED ORDER — IPRATROPIUM BROMIDE 0.02 % IN SOLN
0.5000 mg | Freq: Once | RESPIRATORY_TRACT | Status: AC
  Administered 2013-11-20: 0.5 mg via RESPIRATORY_TRACT
  Filled 2013-11-20: qty 2.5

## 2013-11-20 MED ORDER — PREDNISONE 20 MG PO TABS: 40.0000 mg | ORAL_TABLET | Freq: Every day | ORAL | Status: DC

## 2013-11-20 MED ORDER — ALBUTEROL SULFATE (2.5 MG/3ML) 0.083% IN NEBU
INHALATION_SOLUTION | RESPIRATORY_TRACT | Status: AC
  Filled 2013-11-20: qty 6

## 2013-11-20 MED ORDER — IPRATROPIUM BROMIDE 0.02 % IN SOLN
0.5000 mg | Freq: Once | RESPIRATORY_TRACT | Status: AC
  Administered 2013-11-20: 0.5 mg via RESPIRATORY_TRACT

## 2013-11-20 MED ORDER — ALBUTEROL SULFATE (2.5 MG/3ML) 0.083% IN NEBU
5.0000 mg | INHALATION_SOLUTION | Freq: Once | RESPIRATORY_TRACT | Status: AC
  Administered 2013-11-20: 5 mg via RESPIRATORY_TRACT

## 2013-11-20 MED ORDER — METHYLPREDNISOLONE SODIUM SUCC 125 MG IJ SOLR
125.0000 mg | Freq: Once | INTRAMUSCULAR | Status: AC
  Administered 2013-11-20: 125 mg via INTRAVENOUS
  Filled 2013-11-20: qty 2

## 2013-11-20 MED ORDER — SODIUM CHLORIDE 0.9 % IV BOLUS (SEPSIS)
1000.0000 mL | Freq: Once | INTRAVENOUS | Status: AC
  Administered 2013-11-20: 1000 mL via INTRAVENOUS

## 2013-11-20 MED ORDER — IPRATROPIUM BROMIDE 0.02 % IN SOLN
RESPIRATORY_TRACT | Status: AC
  Administered 2013-11-20: 0.5 mg via RESPIRATORY_TRACT
  Filled 2013-11-20: qty 2.5

## 2013-11-20 MED ORDER — ALBUTEROL SULFATE (2.5 MG/3ML) 0.083% IN NEBU
INHALATION_SOLUTION | RESPIRATORY_TRACT | Status: AC
  Administered 2013-11-20: 5 mg via RESPIRATORY_TRACT
  Filled 2013-11-20: qty 6

## 2013-11-20 MED ORDER — ALBUTEROL SULFATE HFA 108 (90 BASE) MCG/ACT IN AERS
2.0000 | INHALATION_SPRAY | Freq: Once | RESPIRATORY_TRACT | Status: AC
  Administered 2013-11-20: 2 via RESPIRATORY_TRACT
  Filled 2013-11-20: qty 6.7

## 2013-11-20 NOTE — ED Provider Notes (Signed)
CSN: [id number]     Arrival date & time 11/20/13  0947 History   First MD Initiated Contact with Patient 11/20/13 0703     Chief Complaint  Patient presents with  . Shortness of Breath     (Consider location/radiation/quality/duration/timing/severity/associated sxs/prior Treatment) HPI Comments: 53 year old female presents to the emergency department chief complaint of asthma attack. The patient states that she had increasingly severe cough with chest tightness over the past 5 days. Patient states she has run out of her albuterol MDI at home and has been using her family is nebulizer. Patient complains of severe coughing, tightness, difficulty catching her breath. Patient is a chronic daily smoker. She denies any fevers, chills or, myalgias.  The history is provided by the patient and medical records. No language interpreter was used.    Past Medical History  Diagnosis Date  . Asthma    History reviewed. No pertinent past surgical history. No family history on file. History  Substance Use Topics  . Smoking status: Current Every Day Smoker  . Smokeless tobacco: Not on file  . Alcohol Use: Not on file   OB History   Grav Para Term Preterm Abortions TAB SAB Ect Mult Living                 Review of Systems  Constitutional: Negative for fever and chills.  HENT: Negative for trouble swallowing.   Respiratory: Positive for cough and shortness of breath. Negative for wheezing.   Cardiovascular: Negative for chest pain.  Gastrointestinal: Negative for nausea, vomiting, abdominal pain, diarrhea and constipation.  Genitourinary: Negative for dysuria and hematuria.  Musculoskeletal: Negative for arthralgias and myalgias.  Skin: Negative for rash.  Neurological: Negative for numbness.  All other systems reviewed and are negative.     Allergies  Review of patient's allergies indicates no known allergies.  Home Medications   Prior to Admission medications   Medication Sig  Start Date End Date Taking? Authorizing Provider  albuterol (PROVENTIL HFA;VENTOLIN HFA) 108 (90 BASE) MCG/ACT inhaler Inhale 2 puffs into the lungs every 6 (six) hours as needed for wheezing.   Yes Historical Provider, MD   BP 143/120  Pulse 121  Temp(Src) 97.9 F (36.6 C)  Resp 28  Ht 5\' 7"  (1.702 m)  Wt 155 lb (70.308 kg)  BMI 24.27 kg/m2  SpO2 98%  LMP 11/20/2013 Physical Exam  Nursing note and vitals reviewed. Constitutional: She is oriented to person, place, and time. She appears well-developed and well-nourished. No distress.  HENT:  Head: Normocephalic and atraumatic.  Eyes: Conjunctivae are normal. No scleral icterus.  Neck: Normal range of motion.  Cardiovascular: Normal rate, regular rhythm and normal heart sounds.  Exam reveals no gallop and no friction rub.   No murmur heard. Pulmonary/Chest: No respiratory distress.  Intractable cough.  Abdominal: Soft. Bowel sounds are normal. She exhibits no distension and no mass. There is no tenderness. There is no guarding.  Neurological: She is alert and oriented to person, place, and time.  Skin: Skin is warm and dry. She is not diaphoretic.    ED Course  Procedures (including critical care time) Labs Review Labs Reviewed - No data to display  Imaging Review Dg Chest Port 1 View  11/20/2013   CLINICAL DATA:  Asthma with history of tobacco use  EXAM: PORTABLE CHEST - 1 VIEW  COMPARISON:  PA and lateral chest of May 13, 2013  FINDINGS: The lungs are well-expanded and clear. The heart and mediastinal structures are normal.  The bony thorax is unremarkable.  IMPRESSION: There is no acute cardiopulmonary abnormality.   Electronically Signed   By: David  Martinique   On: 11/20/2013 08:10     EKG Interpretation None      MDM   Final diagnoses:  Asthma attack    10:10 AM BP 127/80  Pulse 87  Temp(Src) 97.9 F (36.6 C)  Resp 18  Ht 5\' 7"  (1.702 m)  Wt 155 lb (70.308 kg)  BMI 24.27 kg/m2  SpO2 99%  LMP  11/20/2013  No current signs of respiratory distress. Lung exam improved after nebulizer treatment. Prednisone given in the ED and pt will bd dc with 5 day burst. Pt states they are breathing at baseline. Pt has been instructed to continue using prescribed medications and to speak with PCP about today's exacerbation.      Margarita Mail, PA-C 11/20/13 1013

## 2013-11-20 NOTE — ED Provider Notes (Signed)
Medical screening examination/treatment/procedure(s) were performed by non-physician practitioner and as supervising physician I was immediately available for consultation/collaboration.   EKG Interpretation None        Delice Bison Ward, DO 11/20/13 1515

## 2013-11-20 NOTE — ED Notes (Addendum)
Pt states that she began having SOB with HX of asthma several days ago. No relief with prescribed inhaler

## 2013-11-20 NOTE — Discharge Instructions (Signed)
Asthma, Adult Asthma is a recurring condition in which the airways tighten and narrow. Asthma can make it difficult to breathe. It can cause coughing, wheezing, and shortness of breath. Asthma episodes (also called asthma attacks) range from minor to life-threatening. Asthma cannot be cured, but medicines and lifestyle changes can help control it. CAUSES Asthma is believed to be caused by inherited (genetic) and environmental factors, but its exact cause is unknown. Asthma may be triggered by allergens, lung infections, or irritants in the air. Asthma triggers are different for each person. Common triggers include:   Animal dander.  Dust mites.  Cockroaches.  Pollen from trees or grass.  Mold.  Smoke.  Air pollutants such as dust, household cleaners, hair sprays, aerosol sprays, paint fumes, strong chemicals, or strong odors.  Cold air, weather changes, and winds (which increase molds and pollens in the air).  Strong emotional expressions such as crying or laughing hard.  Stress.  Certain medicines (such as aspirin) or types of drugs (such as beta-blockers).  Sulfites in foods and drinks. Foods and drinks that may contain sulfites include dried fruit, potato chips, and sparkling grape juice.  Infections or inflammatory conditions such as the flu, a cold, or an inflammation of the nasal membranes (rhinitis).  Gastroesophageal reflux disease (GERD).  Exercise or strenuous activity. SYMPTOMS Symptoms may occur immediately after asthma is triggered or many hours later. Symptoms include:  Wheezing.  Excessive nighttime or early morning coughing.  Frequent or severe coughing with a common cold.  Chest tightness.  Shortness of breath. DIAGNOSIS  The diagnosis of asthma is made by a review of your medical history and a physical exam. Tests may also be performed. These may include:  Lung function studies. These tests show how much air you breath in and out.  Allergy  tests.  Imaging tests such as X-rays. TREATMENT  Asthma cannot be cured, but it can usually be controlled. Treatment involves identifying and avoiding your asthma triggers. It also involves medicines. There are 2 classes of medicine used for asthma treatment:   Controller medicines. These prevent asthma symptoms from occurring. They are usually taken every day.  Reliever or rescue medicines. These quickly relieve asthma symptoms. They are used as needed and provide short-term relief. Your health care provider will help you create an asthma action plan. An asthma action plan is a written plan for managing and treating your asthma attacks. It includes a list of your asthma triggers and how they may be avoided. It also includes information on when medicines should be taken and when their dosage should be changed. An action plan may also involve the use of a device called a peak flow meter. A peak flow meter measures how well the lungs are working. It helps you monitor your condition. HOME CARE INSTRUCTIONS   Take medicine as directed by your health care provider. Speak with your health care provider if you have questions about how or when to take the medicines.  Use a peak flow meter as directed by your health care provider. Record and keep track of readings.  Understand and use the action plan to help minimize or stop an asthma attack without needing to seek medical care.  Control your home environment in the following ways to help prevent asthma attacks:  Do not smoke. Avoid being exposed to secondhand smoke.  Change your heating and air conditioning filter regularly.  Limit your use of fireplaces and wood stoves.  Get rid of pests (such as roaches and   mice) and their droppings.  Throw away plants if you see mold on them.  Clean your floors and dust regularly. Use unscented cleaning products.  Try to have someone else vacuum for you regularly. Stay out of rooms while they are being  vacuumed and for a short while afterward. If you vacuum, use a dust mask from a hardware store, a double-layered or microfilter vacuum cleaner bag, or a vacuum cleaner with a HEPA filter.  Replace carpet with wood, tile, or vinyl flooring. Carpet can trap dander and dust.  Use allergy-proof pillows, mattress covers, and box spring covers.  Wash bed sheets and blankets every week in hot water and dry them in a dryer.  Use blankets that are made of polyester or cotton.  Clean bathrooms and kitchens with bleach. If possible, have someone repaint the walls in these rooms with mold-resistant paint. Keep out of the rooms that are being cleaned and painted.  Wash hands frequently. SEEK MEDICAL CARE IF:   You have wheezing, shortness of breath, or a cough even if taking medicine to prevent attacks.  The colored mucus you cough up (sputum) is thicker than usual.  Your sputum changes from clear or white to yellow, green, gray, or bloody.  You have any problems that may be related to the medicines you are taking (such as a rash, itching, swelling, or trouble breathing).  You are using a reliever medicine more than 2 3 times per week.  Your peak flow is still at 50 79% of you personal best after following your action plan for 1 hour. SEEK IMMEDIATE MEDICAL CARE IF:   You seem to be getting worse and are unresponsive to treatment during an asthma attack.  You are short of breath even at rest.  You get short of breath when doing very little physical activity.  You have difficulty eating, drinking, or talking due to asthma symptoms.  You develop chest pain.  You develop a fast heartbeat.  You have a bluish color to your lips or fingernails.  You are lightheaded, dizzy, or faint.  Your peak flow is less than 50% of your personal best.  You have a fever or persistent symptoms for more than 2 3 days.  You have a fever and symptoms suddenly get worse. MAKE SURE YOU:   Understand these  instructions.  Will watch your condition.  Will get help right away if you are not doing well or get worse. Document Released: 05/28/2005 Document Revised: 01/28/2013 Document Reviewed: 12/25/2012 [employer] Patient Information 2014 [employer].  

## 2015-02-04 ENCOUNTER — Encounter: Payer: Self-pay | Admitting: Obstetrics & Gynecology

## 2015-02-15 ENCOUNTER — Ambulatory Visit (INDEPENDENT_AMBULATORY_CARE_PROVIDER_SITE_OTHER): Payer: No Typology Code available for payment source | Admitting: Family Medicine

## 2015-02-15 ENCOUNTER — Encounter: Payer: Self-pay | Admitting: Family Medicine

## 2015-02-15 VITALS — BP 141/89 | HR 84 | Temp 98.2°F | Resp 14 | Ht 67.0 in | Wt 164.0 lb

## 2015-02-15 DIAGNOSIS — R030 Elevated blood-pressure reading, without diagnosis of hypertension: Secondary | ICD-10-CM | POA: Insufficient documentation

## 2015-02-15 DIAGNOSIS — E8881 Metabolic syndrome: Secondary | ICD-10-CM

## 2015-02-15 DIAGNOSIS — K029 Dental caries, unspecified: Secondary | ICD-10-CM

## 2015-02-15 DIAGNOSIS — I8393 Asymptomatic varicose veins of bilateral lower extremities: Secondary | ICD-10-CM

## 2015-02-15 DIAGNOSIS — J4520 Mild intermittent asthma, uncomplicated: Secondary | ICD-10-CM

## 2015-02-15 DIAGNOSIS — F17200 Nicotine dependence, unspecified, uncomplicated: Secondary | ICD-10-CM | POA: Insufficient documentation

## 2015-02-15 LAB — COMPLETE METABOLIC PANEL WITH GFR
ALT: 12 U/L (ref 6–29)
AST: 15 U/L (ref 10–35)
Albumin: 4.4 g/dL (ref 3.6–5.1)
Alkaline Phosphatase: 62 U/L (ref 33–130)
BILIRUBIN TOTAL: 0.4 mg/dL (ref 0.2–1.2)
BUN: 17 mg/dL (ref 7–25)
CO2: 30 mmol/L (ref 20–31)
CREATININE: 0.83 mg/dL (ref 0.50–1.05)
Calcium: 9.8 mg/dL (ref 8.6–10.4)
Chloride: 102 mmol/L (ref 98–110)
GFR, EST NON AFRICAN AMERICAN: 82 mL/min (ref >=60)
GFR, Est African American: >89 mL/min (ref >=60)
GLUCOSE: 77 mg/dL (ref 65–99)
Potassium: 4.4 mmol/L (ref 3.5–5.3)
SODIUM: 137 mmol/L (ref 135–146)
TOTAL PROTEIN: 7.3 g/dL (ref 6.1–8.1)

## 2015-02-15 LAB — CBC WITH DIFFERENTIAL/PLATELET
BASOS ABS: 0 10*3/uL (ref 0.0–0.1)
BASOS PCT: 0 % (ref 0–1)
EOS ABS: 0.1 10*3/uL (ref 0.0–0.7)
Eosinophils Relative: 2 % (ref 0–5)
HCT: 41.9 % (ref 36.0–46.0)
HEMOGLOBIN: 14.6 g/dL (ref 12.0–15.0)
Lymphocytes Relative: 42 % (ref 12–46)
Lymphs Abs: 2.1 10*3/uL (ref 0.7–4.0)
MCH: 33.4 pg (ref 26.0–34.0)
MCHC: 34.8 g/dL (ref 30.0–36.0)
MCV: 95.9 fL (ref 78.0–100.0)
MPV: 9 fL (ref 8.6–12.4)
Monocytes Absolute: 0.2 10*3/uL (ref 0.1–1.0)
Monocytes Relative: 5 % (ref 3–12)
NEUTROS PCT: 51 % (ref 43–77)
Neutro Abs: 2.5 10*3/uL (ref 1.7–7.7)
PLATELETS: 261 10*3/uL (ref 150–400)
RBC: 4.37 MIL/uL (ref 3.87–5.11)
RDW: 13.5 % (ref 11.5–15.5)
WBC: 4.9 10*3/uL (ref 4.0–10.5)

## 2015-02-15 LAB — HEMOGLOBIN A1C
Hgb A1c MFr Bld: 5.4 % (ref <5.7)
Mean Plasma Glucose: 108 mg/dL (ref <117)

## 2015-02-15 LAB — TSH: TSH: 0.442 u[IU]/mL (ref 0.350–4.500)

## 2015-02-15 MED ORDER — VARENICLINE TARTRATE 0.5 MG X 11 & 1 MG X 42 PO MISC: ORAL | Status: DC

## 2015-02-15 NOTE — Progress Notes (Signed)
Subjective:    Patient ID: Brianna Jenkins, female    DOB: 11-21-1960, 54 y.o.   MRN: [id number]  HPI Ms. Brianna Jenkins, a 54 year old female with a history of asthma, prehypertension, and a family history of diabetes presents to establish care. Ms. Brianna Jenkins states that she has not had a primary provider in many years since relocating to this area. She is currently a smoker with 0.5 packs per day history for greater than 20 years.   Brianna Jenkins has a history of asthma.Patient currently does not have asthma symptoms.  Asthma symptoms have been controlled. Medications used in the past to treat these symptoms include beta agonist inhalers and inhaled steroids. Patient has not identified precipitants that trigger asthma exacerbation. Patient denies being awoken from sleep previously due to asthma exacerbation.     Past Medical History  Diagnosis Date  . Asthma    Current outpatient prescriptions:  .  albuterol (PROVENTIL HFA;VENTOLIN HFA) 108 (90 BASE) MCG/ACT inhaler, Inhale 2 puffs into the lungs every 6 (six) hours as needed for wheezing., Disp: , Rfl:  .  predniSONE (DELTASONE) 20 MG tablet, Take 2 tablets (40 mg total) by mouth daily. (Patient not taking: Reported on 02/15/2015), Disp: 10 tablet, Rfl: 0   No Known Allergies Current smoker Review of Systems  Constitutional: Negative.  Negative for fever and fatigue.  HENT: Negative.  Negative for dental problem.   Eyes: Negative.   Respiratory: Negative.  Negative for shortness of breath.   Cardiovascular: Negative.   Gastrointestinal: Negative for blood in stool.  Endocrine: Negative.  Negative for polydipsia, polyphagia and polyuria.  Genitourinary: Negative.   Musculoskeletal: Negative.   Skin: Negative.        Varicose veins  Allergic/Immunologic: Negative.   Neurological: Negative.   Hematological: Negative.   Psychiatric/Behavioral: Negative.  Negative for suicidal ideas and sleep disturbance.        Objective:   Physical Exam  Constitutional: She is oriented to person, place, and time. She appears well-developed and well-nourished.  HENT:  Head: Normocephalic.  Right Ear: External ear normal.  Mouth/Throat: Oropharynx is clear and moist.  Eyes: Conjunctivae are normal. Pupils are equal, round, and reactive to light.  Neck: Normal range of motion.  Cardiovascular: Normal rate, regular rhythm, normal heart sounds and intact distal pulses.   Pulmonary/Chest: Effort normal and breath sounds normal. No apnea and no tachypnea. No respiratory distress.  Abdominal: Soft. Bowel sounds are normal.  Musculoskeletal: Normal range of motion.  Neurological: She is alert and oriented to person, place, and time.  Skin: Skin is warm and dry.  Varicose veins to lower extremities  Psychiatric: She has a normal mood and affect. Her behavior is normal. Judgment and thought content normal.         BP 141/89 mmHg  Pulse 84  Temp(Src) 98.2 F (36.8 C) (Oral)  Resp 14  Ht 5\' 7"  (1.702 m)  Wt 164 lb (74.39 kg)  BMI 25.68 kg/m2  LMP  Assessment & Plan:   1. Asthma, mild intermittent, uncomplicated Patient states that asthma is controlled. Her last asthma exacerbation was greater than 1 month ago.   2. Metabolic syndrome Brianna Jenkins has a history of a 10 pound weight gain over the past year. She also has a strong family history of diabetes (mother and aunt). Will review labs as they come available.  - CBC with Differential - COMPLETE METABOLIC PANEL WITH GFR - TSH - POCT urinalysis dipstick - Hemoglobin A1c  3. Asymptomatic varicose veins, bilateral Brianna Jenkins has pronounced varicose veins that are periodically painful. Will send referral to vein and vascular as it becomes available.   4. Tobacco dependence Discussed starting Chantix with patient at length. Smoking cessation instruction/counseling given:  counseled patient on the dangers of tobacco use, advised patient to stop smoking, and reviewed  strategies to maximize success.   5. Prehypertension Patient will return in 1 week for a blood pressure check.   6. Dental caries Referral form sent for [facility]   Preventative care:  Will come in 1 week prior to next appointment for fasting lipid panel Will send referral for Mammogram, Patient has not had a mammogram in several years Will return in 4 months for pap smear, has a history of abnormal pap smear    Outpatient Encounter Prescriptions as of 02/15/2015  Medication Sig  . albuterol (PROVENTIL HFA;VENTOLIN HFA) 108 (90 BASE) MCG/ACT inhaler Inhale 2 puffs into the lungs every 6 (six) hours as needed for wheezing.  . predniSONE (DELTASONE) 20 MG tablet Take 2 tablets (40 mg total) by mouth daily. (Patient not taking: Reported on 02/15/2015)  . varenicline (CHANTIX STARTING MONTH PAK) 0.5 MG X 11 & 1 MG X 42 tablet Take one 0.5 mg tablet by mouth once daily for 3 days, then increase to one 0.5 mg tablet twice daily for 4 days, then increase to one 1 mg tablet twice daily.   No facility-administered encounter medications on file as of 02/15/2015.   Dorena Dew, FNP

## 2015-02-15 NOTE — Patient Instructions (Signed)
Smoking Cessation Quitting smoking is important to your health and has many advantages. However, it is not always easy to quit since nicotine is a very addictive drug. Oftentimes, people try 3 times or more before being able to quit. This document explains the best ways for you to prepare to quit smoking. Quitting takes hard work and a lot of effort, but you can do it. ADVANTAGES OF QUITTING SMOKING  You will live longer, feel better, and live better.  Your body will feel the impact of quitting smoking almost immediately.  Within 20 minutes, blood pressure decreases. Your pulse returns to its normal level.  After 8 hours, carbon monoxide levels in the blood return to normal. Your oxygen level increases.  After 24 hours, the chance of having a heart attack starts to decrease. Your breath, hair, and body stop smelling like smoke.  After 48 hours, damaged nerve endings begin to recover. Your sense of taste and smell improve.  After 72 hours, the body is virtually free of nicotine. Your bronchial tubes relax and breathing becomes easier.  After 2 to 12 weeks, lungs can hold more air. Exercise becomes easier and circulation improves.  The risk of having a heart attack, stroke, cancer, or lung disease is greatly reduced.  After 1 year, the risk of coronary heart disease is cut in half.  After 5 years, the risk of stroke falls to the same as a nonsmoker.  After 10 years, the risk of lung cancer is cut in half and the risk of other cancers decreases significantly.  After 15 years, the risk of coronary heart disease drops, usually to the level of a nonsmoker.  If you are pregnant, quitting smoking will improve your chances of having a healthy baby.  The people you live with, especially any children, will be healthier.  You will have extra money to spend on things other than cigarettes. QUESTIONS TO THINK ABOUT BEFORE ATTEMPTING TO QUIT You may want to talk about your answers with your  health care provider.  Why do you want to quit?  If you tried to quit in the past, what helped and what did not?  What will be the most difficult situations for you after you quit? How will you plan to handle them?  Who can help you through the tough times? Your family? Friends? A health care provider?  What pleasures do you get from smoking? What ways can you still get pleasure if you quit? Here are some questions to ask your health care provider:  How can you help me to be successful at quitting?  What medicine do you think would be best for me and how should I take it?  What should I do if I need more help?  What is smoking withdrawal like? How can I get information on withdrawal? GET READY  Set a quit date.  Change your environment by getting rid of all cigarettes, ashtrays, matches, and lighters in your home, car, or work. Do not let people smoke in your home.  Review your past attempts to quit. Think about what worked and what did not. GET SUPPORT AND ENCOURAGEMENT You have a better chance of being successful if you have help. You can get support in many ways.  Tell your family, friends, and coworkers that you are going to quit and need their support. Ask them not to smoke around you.  Get individual, group, or telephone counseling and support. Programs are available at local hospitals and health centers. Call   your local health department for information about programs in your area.  Spiritual beliefs and practices may help some smokers quit.  Download a "quit meter" on your computer to keep track of quit statistics, such as how long you have gone without smoking, cigarettes not smoked, and money saved.  Get a self-help book about quitting smoking and staying off tobacco. [place] yourself from urges to smoke. Talk to someone, go for a walk, or occupy your time with a task.  Change your normal routine. Take a different route to work.  Drink tea instead of coffee. Eat breakfast in a different place.  Reduce your stress. Take a hot bath, exercise, or read a book.  Plan something enjoyable to do every day. Reward yourself for not smoking.  Explore interactive web-based programs that specialize in helping you quit. GET MEDICINE AND USE IT CORRECTLY Medicines can help you stop smoking and decrease the urge to smoke. Combining medicine with the above behavioral methods and support can greatly increase your chances of successfully quitting smoking.  Nicotine replacement therapy helps deliver nicotine to your body without the negative effects and risks of smoking. Nicotine replacement therapy includes nicotine gum, lozenges, inhalers, nasal sprays, and skin patches. Some may be available over-the-counter and others require a prescription.  Antidepressant medicine helps people abstain from smoking, but how this works is unknown. This medicine is available by prescription.  Nicotinic receptor partial agonist medicine simulates the effect of nicotine in your brain. This medicine is available by prescription. Ask your health care provider for advice about which medicines to use and how to use them based on your health history. Your health care provider will tell you what side effects to look out for if you choose to be on a medicine or therapy. Carefully read the information on the package. Do not use any other product containing nicotine while using a nicotine replacement product.  RELAPSE OR DIFFICULT SITUATIONS Most relapses occur within the first 3 months after quitting. Do not be discouraged if you start smoking again. Remember, most people try several times before finally quitting. You may have symptoms of withdrawal because your body is used to nicotine. You may crave cigarettes, be irritable, feel very hungry, cough often, get headaches, or have difficulty concentrating. The withdrawal symptoms are only temporary. They are strongest  when you first quit, but they will go away within 10-14 days. To reduce the chances of relapse, try to:  Avoid drinking alcohol. Drinking lowers your chances of successfully quitting.  Reduce the amount of caffeine you consume. Once you quit smoking, the amount of caffeine in your body increases and can give you symptoms, such as a rapid heartbeat, sweating, and anxiety.  Avoid smokers because they can make you want to smoke.  Do not let weight gain distract you. Many smokers will gain weight when they quit, usually less than 10 pounds. Eat a healthy diet and stay active. You can always lose the weight gained after you quit.  Find ways to improve your mood other than smoking. FOR MORE INFORMATION  www.smokefree.gov  Document Released: 05/22/2001 Document Revised: 10/12/2013 Document Reviewed: 09/06/2011 [facility] Patient Information 2015 [place], [place]. This information is not intended to replace advice given to you by your health care provider. Make sure you discuss any questions you have with your health care provider. Varicose Veins Varicose veins are veins that have become enlarged and twisted. CAUSES This condition is the result of valves in the  veins not working properly. Valves in the veins help return blood from the leg to the heart. If these valves are damaged, blood flows backwards and backs up into the veins in the leg near the skin. This causes the veins to become larger. People who are on their feet a lot, who are pregnant, or who are overweight are more likely to develop varicose veins. SYMPTOMS   Bulging, twisted-appearing, bluish veins, most commonly found on the legs.  Leg pain or a feeling of heaviness. These symptoms may be worse at the end of the day.  Leg swelling.  Skin color changes. DIAGNOSIS  Varicose veins can usually be diagnosed with an exam of your legs by your caregiver. He or she may recommend an ultrasound of your leg veins. TREATMENT  Most varicose  veins can be treated at home.However, other treatments are available for people who have persistent symptoms or who want to treat the cosmetic appearance of the varicose veins. These include:  Laser treatment of very small varicose veins.  Medicine that is shot (injected) into the vein. This medicine hardens the walls of the vein and closes off the vein. This treatment is called sclerotherapy. Afterwards, you may need to wear clothing or bandages that apply pressure.  Surgery. HOME CARE INSTRUCTIONS   Do not stand or sit in one position for long periods of time. Do not sit with your legs crossed. Rest with your legs raised during the day.  Wear elastic stockings or support hose. Do not wear other tight, encircling garments around the legs, pelvis, or waist.  Walk as much as possible to increase blood flow.  Raise the foot of your bed at night with 2-inch blocks.  If you get a cut in the skin over the vein and the vein bleeds, lie down with your leg raised and press on it with a clean cloth until the bleeding stops. Then place a bandage (dressing) on the cut. See your caregiver if it continues to bleed or needs stitches. SEEK MEDICAL CARE IF:   The skin around your ankle starts to break down.  You have pain, redness, tenderness, or hard swelling developing in your leg over a vein.  You are uncomfortable due to leg pain. Document Released: 03/07/2005 Document Revised: 08/20/2011 Document Reviewed: 07/24/2010 [facility] Patient Information 2015 [place], [place]. This information is not intended to replace advice given to you by your health care provider. Make sure you discuss any questions you have with your health care provider.

## 2015-02-16 LAB — POCT URINALYSIS DIP (DEVICE)
Bilirubin Urine: NEGATIVE
GLUCOSE, UA: NEGATIVE mg/dL
Hgb urine dipstick: NEGATIVE
KETONES UR: NEGATIVE mg/dL
LEUKOCYTES UA: NEGATIVE
Nitrite: NEGATIVE
Protein, ur: NEGATIVE mg/dL
Specific Gravity, Urine: 1.025 (ref 1.005–1.030)
UROBILINOGEN UA: 0.2 mg/dL (ref 0.0–1.0)
pH: 6 (ref 5.0–8.0)

## 2015-02-22 ENCOUNTER — Other Ambulatory Visit: Payer: PRIVATE HEALTH INSURANCE | Admitting: Family Medicine

## 2015-02-22 ENCOUNTER — Other Ambulatory Visit: Payer: Self-pay | Admitting: Family Medicine

## 2015-02-22 DIAGNOSIS — Z1231 Encounter for screening mammogram for malignant neoplasm of breast: Secondary | ICD-10-CM

## 2015-03-01 ENCOUNTER — Encounter (HOSPITAL_COMMUNITY): Payer: PRIVATE HEALTH INSURANCE

## 2015-03-01 ENCOUNTER — Ambulatory Visit (HOSPITAL_COMMUNITY)
Admission: RE | Admit: 2015-03-01 | Discharge: 2015-03-01 | Disposition: A | Discharge status: Home / Self Care | Payer: PRIVATE HEALTH INSURANCE | Source: Ambulatory Visit | Attending: Family Medicine | Admitting: Family Medicine

## 2015-03-01 ENCOUNTER — Ambulatory Visit (HOSPITAL_COMMUNITY): Payer: PRIVATE HEALTH INSURANCE

## 2015-03-01 DIAGNOSIS — Z1231 Encounter for screening mammogram for malignant neoplasm of breast: Secondary | ICD-10-CM

## 2015-03-09 ENCOUNTER — Telehealth: Payer: Self-pay | Admitting: General Practice

## 2015-03-09 NOTE — Telephone Encounter (Signed)
Patient calling about dental referral.

## 2015-03-10 NOTE — Telephone Encounter (Signed)
This has been sent to [facility]. Thanks!

## 2015-04-14 ENCOUNTER — Ambulatory Visit: Payer: PRIVATE HEALTH INSURANCE | Admitting: Family Medicine

## 2015-05-19 ENCOUNTER — Encounter: Payer: Self-pay | Admitting: Family Medicine

## 2015-05-19 ENCOUNTER — Ambulatory Visit (INDEPENDENT_AMBULATORY_CARE_PROVIDER_SITE_OTHER): Payer: No Typology Code available for payment source | Admitting: Family Medicine

## 2015-05-19 VITALS — BP 122/91 | HR 96 | Temp 98.1°F | Ht 67.0 in | Wt 170.0 lb

## 2015-05-19 DIAGNOSIS — R030 Elevated blood-pressure reading, without diagnosis of hypertension: Secondary | ICD-10-CM

## 2015-05-19 MED ORDER — VARENICLINE TARTRATE 1 MG PO TABS: ORAL_TABLET | ORAL | Status: DC

## 2015-05-19 NOTE — Patient Instructions (Signed)
Come back once a week for next 2 weeks for NV for BP check Work on smoking cessation.  Watch salt in diet.  Take Chantix according to direction on bottle.

## 2015-05-19 NOTE — Progress Notes (Signed)
Patient ID: Brianna Jenkins, female   DOB: [birth year], 54 y.o.   MRN: [id number]   Brianna Jenkins, is a 54 y.o. female  [id number]  [id number]  DOB - 1961-04-20  CC:  Chief Complaint  Patient presents with  . Hypertension       HPI: Brianna Jenkins is a 54 y.o. female here to follow-up on blood pressure. She is in the process of becoming a full-time [employer] and her BP was check at [employer] and found to be elevated. She was told to follow-up her. She is not on BP medication. Her only current medication is albuterol for mild intermittent asthma. He BP Yesterday was 139/114. Today it is 137/91 and a repeat manually 122/91. She does smoke cigarettes and is insterested in using Chantix to stop. This has been prescribed in the past but never acutally used. She drinks about 2 glasses of wine weekly and does not use illicit drugs. She is on a regular diet and does not exercise regularly.She does have some health maintenance issues needing to be addressed. Will will address those at her next visit.   No Known Allergies Past Medical History  Diagnosis Date  . Asthma    Current Outpatient Prescriptions on File Prior to Visit  Medication Sig Dispense Refill  . albuterol (PROVENTIL HFA;VENTOLIN HFA) 108 (90 BASE) MCG/ACT inhaler Inhale 2 puffs into the lungs every 6 (six) hours as needed for wheezing.     No current facility-administered medications on file prior to visit.   Family History  Problem Relation Age of Onset  . Hyperlipidemia Mother   . Depression Mother   . Depression Maternal Aunt    Social History   Social History  . Marital Status: Single    Spouse Name: N/A  . Number of Children: N/A  . Years of Education: N/A   Occupational History  . Not on file.   Social History Main Topics  . Smoking status: Current Every Day Smoker -- 1.00 packs/day    Types: Cigarettes  . Smokeless tobacco: Never Used  . Alcohol Use: 1.2 oz/week    2 Glasses of wine per  week  . Drug Use: No  . Sexual Activity: Not on file   Other Topics Concern  . Not on file   Social History Narrative    Review of Systems: Constitutional: Negative for fever, chills, appetite change, weight loss,  Fatigue. Skin: Negative for rashes or lesions of concern. HENT: Negative for ear pain, ear discharge.nose bleeds Eyes: Negative for pain, discharge, redness, itching and visual disturbance. Neck: Negative for pain, stiffness Respiratory: Negative for cough, shortness of breath,   Cardiovascular: Negative for chest pain, palpitations and leg swelling. Gastrointestinal: Negative for abdominal pain, nausea, vomiting, diarrhea, constipations Genitourinary: Negative for dysuria, urgency, frequency, hematuria,  Musculoskeletal: Negative for back pain, joint pain, joint  swelling, and gait problem.Negative for weakness. Neurological: Negative for dizziness, tremors, seizures, syncope,   light-headedness, numbness and headaches.  Hematological: Negative for easy bruising or bleeding Psychiatric/Behavioral: Negative for depression, anxiety, decreased concentration, confusion   Objective:   Filed Vitals:   05/19/15 1108 05/19/15 1119  BP: 137/91 122/91  Pulse: 96   Temp: 98.1 F (36.7 C)     Physical Exam: Constitutional: Patient appears well-developed and well-nourished. No distress. HENT: Normocephalic, atraumatic, External right and left ear normal. Oropharynx is clear and moist.  Eyes: Conjunctivae and EOM are normal. PERRLA, no scleral icterus. Neck: Normal ROM. Neck supple. No lymphadenopathy, No thyromegaly. CVS: RRR, S1/S2 +,  no murmurs, no gallops, no rubs Pulmonary: Effort and breath sounds normal, no stridor, rhonchi, wheezes, rales.  Abdominal: Soft. Normoactive BS,, no distension, tenderness, rebound or guarding.  Musculoskeletal: Normal range of motion. No edema and no tenderness.  Neuro: Alert.Normal muscle tone coordination. Non-focal Skin: Skin is warm  and dry. No rash noted. Not diaphoretic. No erythema. No pallor. Psychiatric: Normal mood and affect. Behavior, judgment, thought content normal.  Lab Results  Component Value Date   WBC 4.9 02/15/2015   HGB 14.6 02/15/2015   HCT 41.9 02/15/2015   MCV 95.9 02/15/2015   PLT 261 02/15/2015   Lab Results  Component Value Date   CREATININE 0.83 02/15/2015   BUN 17 02/15/2015   NA 137 02/15/2015   K 4.4 02/15/2015   CL 102 02/15/2015   CO2 30 02/15/2015    Lab Results  Component Value Date   HGBA1C 5.4 02/15/2015   Lipid Panel     Component Value Date/Time   CHOL 184 02/17/2010 2144   TRIG 110 02/17/2010 2144   HDL 60 02/17/2010 2144   CHOLHDL 3.1 Ratio 02/17/2010 2144   VLDL 22 02/17/2010 2144   LDLCALC 102* 02/17/2010 2144       Assessment and plan:   There are no diagnoses linked to this encounter.  1. Borderline hypertension -She is to return in weeks for a nurse visit for a bp check and has a follow-up visit with Mrs. Hollis in January. -Have advised her to avoid salt and to try to wal regularly.  2. Health maintenance -Will offer flu shot when she returns in a week. -Will address other health maintenance   Return in about 1 week (around 05/26/2015).  The patient was given clear instructions to go to ER or return to medical center if symptoms don't improve, worsen or new problems develop. The patient verbalized understanding.    Micheline Chapman FNP  05/23/2015, 7:46 AM

## 2015-05-20 ENCOUNTER — Ambulatory Visit: Payer: No Typology Code available for payment source | Admitting: Family Medicine

## 2015-05-23 ENCOUNTER — Other Ambulatory Visit: Payer: No Typology Code available for payment source

## 2015-05-30 ENCOUNTER — Other Ambulatory Visit: Payer: No Typology Code available for payment source

## 2015-06-17 ENCOUNTER — Ambulatory Visit: Payer: PRIVATE HEALTH INSURANCE | Admitting: Family Medicine

## 2015-07-08 DIAGNOSIS — F17210 Nicotine dependence, cigarettes, uncomplicated: Secondary | ICD-10-CM | POA: Diagnosis not present

## 2015-07-08 DIAGNOSIS — R030 Elevated blood-pressure reading, without diagnosis of hypertension: Secondary | ICD-10-CM | POA: Diagnosis not present

## 2015-07-08 DIAGNOSIS — Z1211 Encounter for screening for malignant neoplasm of colon: Secondary | ICD-10-CM | POA: Diagnosis not present

## 2015-07-11 DIAGNOSIS — N951 Menopausal and female climacteric states: Secondary | ICD-10-CM | POA: Diagnosis not present

## 2015-07-11 DIAGNOSIS — Z01419 Encounter for gynecological examination (general) (routine) without abnormal findings: Secondary | ICD-10-CM | POA: Diagnosis not present

## 2015-07-11 DIAGNOSIS — Z1151 Encounter for screening for human papillomavirus (HPV): Secondary | ICD-10-CM | POA: Diagnosis not present

## 2015-07-11 MED FILL — ESTRADIOL-NORETH 0.5-0.1 MG: 0.5-0.1 | 28 days supply | Qty: 28 | Fill #0

## 2015-07-11 MED FILL — HYDROCHLOROTHIAZIDE 12.5 MG: 12.5 | 90 days supply | Qty: 90 | Fill #0

## 2015-07-12 DIAGNOSIS — R87612 Low grade squamous intraepithelial lesion on cytologic smear of cervix (LGSIL): Secondary | ICD-10-CM | POA: Diagnosis not present

## 2015-07-12 DIAGNOSIS — Z01419 Encounter for gynecological examination (general) (routine) without abnormal findings: Secondary | ICD-10-CM | POA: Diagnosis not present

## 2015-07-13 MED FILL — GAVILYTE-N SOLUTION: 420 | 1 days supply | Qty: 4000 | Fill #0

## 2015-07-15 ENCOUNTER — Other Ambulatory Visit: Payer: Self-pay | Admitting: Gastroenterology

## 2015-07-15 DIAGNOSIS — D3A026 Benign carcinoid tumor of the rectum: Secondary | ICD-10-CM | POA: Diagnosis not present

## 2015-07-15 DIAGNOSIS — K621 Rectal polyp: Secondary | ICD-10-CM | POA: Diagnosis not present

## 2015-07-15 DIAGNOSIS — Z1211 Encounter for screening for malignant neoplasm of colon: Secondary | ICD-10-CM | POA: Diagnosis not present

## 2015-07-20 DIAGNOSIS — H524 Presbyopia: Secondary | ICD-10-CM | POA: Diagnosis not present

## 2015-07-27 DIAGNOSIS — R87612 Low grade squamous intraepithelial lesion on cytologic smear of cervix (LGSIL): Secondary | ICD-10-CM | POA: Diagnosis not present

## 2015-08-09 DIAGNOSIS — R1013 Epigastric pain: Secondary | ICD-10-CM | POA: Diagnosis not present

## 2015-08-09 MED FILL — PANTOPRAZOLE SOD DR 20 MG T: 20 | 30 days supply | Qty: 30 | Fill #0

## 2015-08-12 DIAGNOSIS — R05 Cough: Secondary | ICD-10-CM | POA: Diagnosis not present

## 2015-08-12 DIAGNOSIS — I10 Essential (primary) hypertension: Secondary | ICD-10-CM | POA: Diagnosis not present

## 2015-08-12 DIAGNOSIS — I8392 Asymptomatic varicose veins of left lower extremity: Secondary | ICD-10-CM | POA: Diagnosis not present

## 2015-08-12 MED FILL — HYDROCHLOROTHIAZIDE 25 MG T: 25 | 90 days supply | Qty: 90 | Fill #0

## 2015-08-12 MED FILL — predniSONE 10 MG (21) TBPK: 10 | 6 days supply | Qty: 21 | Fill #0

## 2015-08-12 MED FILL — AMLODIPINE BESYLATE 5 MG TA: 5 | 90 days supply | Qty: 90 | Fill #0

## 2015-08-23 ENCOUNTER — Other Ambulatory Visit: Payer: Self-pay | Admitting: *Deleted

## 2015-08-23 DIAGNOSIS — I83893 Varicose veins of bilateral lower extremities with other complications: Secondary | ICD-10-CM

## 2015-10-13 ENCOUNTER — Encounter: Payer: Self-pay | Admitting: Vascular Surgery

## 2015-10-14 MED FILL — PANTOPRAZOLE SOD DR 20 MG T: 20 | 30 days supply | Qty: 30 | Fill #1

## 2015-10-18 ENCOUNTER — Ambulatory Visit (HOSPITAL_COMMUNITY)
Admission: RE | Admit: 2015-10-18 | Discharge: 2015-10-18 | Disposition: A | Discharge status: Home / Self Care | Payer: 59 | Source: Ambulatory Visit | Attending: Vascular Surgery | Admitting: Vascular Surgery

## 2015-10-18 ENCOUNTER — Encounter: Payer: Self-pay | Admitting: Vascular Surgery

## 2015-10-18 ENCOUNTER — Ambulatory Visit (INDEPENDENT_AMBULATORY_CARE_PROVIDER_SITE_OTHER): Payer: 59 | Admitting: Vascular Surgery

## 2015-10-18 VITALS — BP 132/95 | HR 68 | Temp 98.0°F | Resp 16 | Ht 67.5 in | Wt 164.0 lb

## 2015-10-18 DIAGNOSIS — I8393 Asymptomatic varicose veins of bilateral lower extremities: Secondary | ICD-10-CM

## 2015-10-18 DIAGNOSIS — R609 Edema, unspecified: Secondary | ICD-10-CM | POA: Diagnosis present

## 2015-10-18 DIAGNOSIS — I83893 Varicose veins of bilateral lower extremities with other complications: Secondary | ICD-10-CM | POA: Insufficient documentation

## 2015-10-18 NOTE — Progress Notes (Signed)
Subjective:     Patient ID: Brianna Jenkins, female   DOB: 1960-09-20, 55 y.o.   MRN: [id number]  HPIThis 55 year old female is evaluated for bad veins in both legs. She has had these veins which are quite prominent over the past several years. She develops itching and stinging on occasion. She will occasionally have some edema in the ankles. She has no history of bulging varicose veins, DVT, stasis ulcers, or bleeding. She does not elastic compression stockings. She has never had previous treatment.  Past Medical History  Diagnosis Date  . Asthma     Social History  Substance Use Topics  . Smoking status: Current Every Day Smoker -- 1.00 packs/day    Types: Cigarettes  . Smokeless tobacco: Never Used  . Alcohol Use: 1.2 oz/week    2 Glasses of wine per week    Family History  Problem Relation Age of Onset  . Hyperlipidemia Mother   . Depression Mother   . Depression Maternal Aunt     No Known Allergies   Current outpatient prescriptions:  .  albuterol (PROVENTIL HFA;VENTOLIN HFA) 108 (90 BASE) MCG/ACT inhaler, Inhale 2 puffs into the lungs every 6 (six) hours as needed for wheezing., Disp: , Rfl:  .  hydrochlorothiazide (HYDRODIURIL) 25 MG tablet, Take 25 mg by mouth daily., Disp: , Rfl:  .  varenicline (CHANTIX CONTINUING MONTH PAK) 1 MG tablet, Take 1/2 tablet daily for 3 days, then twice a day for  4 days, the start one tablet bid. (Patient not taking: Reported on 10/18/2015), Disp: 30 tablet, Rfl: 2  Filed Vitals:   10/18/15 1427  BP: 132/95  Pulse: 68  Temp: 98 F (36.7 C)  Resp: 16  Height: 5' 7.5" (1.715 m)  Weight: 164 lb (74.39 kg)  SpO2: 99%    Body mass index is 25.29 kg/(m^2).           Review of SystemsDenies chest pain, dyspnea on exertion, PND, orthopnea, hemoptysis, claudication     Objective:   Physical Exam BP 132/95 mmHg  Pulse 68  Temp(Src) 98 F (36.7 C)  Resp 16  Ht 5' 7.5" (1.715 m)  Wt 164 lb (74.39 kg)  BMI 25.29 kg/m2  SpO2  99%   Gen.-alert and oriented x3 in no apparent distress HEENT normal for age Lungs no rhonchi or wheezing Cardiovascular regular rhythm no murmurs carotid pulses 3+ palpable no bruits audible Abdomen soft nontender no palpable masses Musculoskeletal free of  major deformities Skin clear -no rashes Neurologic normal Lower extremities 3+ femoral and dorsalis pedis pulses palpable bilaterally with no edema Both legs with diffuse spider veins and anterior and lateral thighs and posterior thigh and calf areas. No hyperpigmentation or ulceration noted. No bulging varicosities noted.  Today I ordered bilateral venous duplex exam chart reviewed and interpreted. There is no DVT. There is no significant superficial venous reflux.      Assessment:     Bilateral spider veins with no superficial or deep venous reflux     Plan:     Have recommended foam sclerotherapy to the patient if she would like treatment which she will consider

## 2015-11-16 MED FILL — HYDROCHLOROTHIAZIDE 25 MG T: 25 | 90 days supply | Qty: 90 | Fill #1

## 2015-11-18 DIAGNOSIS — E7800 Pure hypercholesterolemia, unspecified: Secondary | ICD-10-CM | POA: Diagnosis not present

## 2015-11-18 DIAGNOSIS — I10 Essential (primary) hypertension: Secondary | ICD-10-CM | POA: Diagnosis not present

## 2015-11-18 DIAGNOSIS — M542 Cervicalgia: Secondary | ICD-10-CM | POA: Diagnosis not present

## 2015-11-18 DIAGNOSIS — M25562 Pain in left knee: Secondary | ICD-10-CM | POA: Diagnosis not present

## 2015-11-18 DIAGNOSIS — F17210 Nicotine dependence, cigarettes, uncomplicated: Secondary | ICD-10-CM | POA: Diagnosis not present

## 2015-11-18 MED FILL — BUPROPION SR 150 MG TABLET: 150 | 30 days supply | Qty: 30 | Fill #0

## 2015-11-18 MED FILL — IBUPROFEN 800 MG TABLET: 800 | 10 days supply | Qty: 15 | Fill #0

## 2015-11-18 MED FILL — METHOCARBAMOL 750 MG TABLET: 750 | 10 days supply | Qty: 30 | Fill #0

## 2015-11-22 MED FILL — PROAIR RESPICLICK INHAL PWD: 108 (90 BAS | 30 days supply | Qty: 1 | Fill #0

## 2015-12-23 DIAGNOSIS — Z86010 Personal history of colonic polyps: Secondary | ICD-10-CM | POA: Diagnosis not present

## 2015-12-23 DIAGNOSIS — Z9889 Other specified postprocedural states: Secondary | ICD-10-CM | POA: Diagnosis not present

## 2016-01-12 MED FILL — PANTOPRAZOLE SOD DR 20 MG T: 20 | 90 days supply | Qty: 90 | Fill #0

## 2016-02-07 DIAGNOSIS — G5602 Carpal tunnel syndrome, left upper limb: Secondary | ICD-10-CM | POA: Diagnosis not present

## 2016-03-29 DIAGNOSIS — J069 Acute upper respiratory infection, unspecified: Secondary | ICD-10-CM | POA: Diagnosis not present

## 2016-03-29 MED FILL — AZITHROMYCIN 250 MG TABLET: 250 | 5 days supply | Qty: 6 | Fill #0

## 2016-03-29 MED FILL — predniSONE 10 MG (21) TBPK: 10 | 6 days supply | Qty: 21 | Fill #0

## 2016-03-29 MED FILL — SCOPOLAMINE 1 MG/3 DAY PATC: 1 | 9 days supply | Qty: 3 | Fill #0

## 2016-04-12 MED FILL — AMLODIPINE BESYLATE 5 MG TA: 5 | 90 days supply | Qty: 90 | Fill #1

## 2016-04-12 MED FILL — HYDROCHLOROTHIAZIDE 25 MG T: 25 | 90 days supply | Qty: 90 | Fill #2

## 2016-04-25 DIAGNOSIS — B9689 Other specified bacterial agents as the cause of diseases classified elsewhere: Secondary | ICD-10-CM | POA: Diagnosis not present

## 2016-04-25 DIAGNOSIS — H66002 Acute suppurative otitis media without spontaneous rupture of ear drum, left ear: Secondary | ICD-10-CM | POA: Diagnosis not present

## 2016-04-25 DIAGNOSIS — J028 Acute pharyngitis due to other specified organisms: Secondary | ICD-10-CM | POA: Diagnosis not present

## 2016-04-25 DIAGNOSIS — J209 Acute bronchitis, unspecified: Secondary | ICD-10-CM | POA: Diagnosis not present

## 2016-04-25 MED FILL — MOXIFLOXACIN HCL 400 MG TAB: 400 | 10 days supply | Qty: 10 | Fill #0

## 2016-04-25 MED FILL — HYDROCODONE-CHLORPHENIRAM S: 10-8 | 10 days supply | Qty: 100 | Fill #0

## 2016-06-06 MED FILL — HYDROCHLOROTHIAZIDE 25 MG T: 25 | 30 days supply | Qty: 30 | Fill #3

## 2016-07-20 DIAGNOSIS — H524 Presbyopia: Secondary | ICD-10-CM | POA: Diagnosis not present

## 2016-07-20 DIAGNOSIS — I10 Essential (primary) hypertension: Secondary | ICD-10-CM | POA: Diagnosis not present

## 2016-07-20 DIAGNOSIS — Z0000 Encounter for general adult medical examination without abnormal findings: Secondary | ICD-10-CM | POA: Diagnosis not present

## 2016-07-20 DIAGNOSIS — F17210 Nicotine dependence, cigarettes, uncomplicated: Secondary | ICD-10-CM | POA: Diagnosis not present

## 2016-07-20 DIAGNOSIS — Z23 Encounter for immunization: Secondary | ICD-10-CM | POA: Diagnosis not present

## 2016-07-20 MED FILL — AMLODIPINE BESYLATE 5 MG TA: 5 | 30 days supply | Qty: 30 | Fill #2

## 2016-08-15 MED FILL — PANTOPRAZOLE SOD DR 20 MG T: 20 | 90 days supply | Qty: 90 | Fill #1

## 2016-08-15 MED FILL — PROAIR RESPICLICK INHAL PWD: 108 (90 BAS | 30 days supply | Qty: 1 | Fill #1

## 2016-09-28 DIAGNOSIS — Z1231 Encounter for screening mammogram for malignant neoplasm of breast: Secondary | ICD-10-CM | POA: Diagnosis not present

## 2016-09-28 DIAGNOSIS — R87612 Low grade squamous intraepithelial lesion on cytologic smear of cervix (LGSIL): Secondary | ICD-10-CM | POA: Diagnosis not present

## 2016-09-28 DIAGNOSIS — Z6825 Body mass index (BMI) 25.0-25.9, adult: Secondary | ICD-10-CM | POA: Diagnosis not present

## 2016-09-28 DIAGNOSIS — Z01419 Encounter for gynecological examination (general) (routine) without abnormal findings: Secondary | ICD-10-CM | POA: Diagnosis not present

## 2016-09-28 MED FILL — YUVAFEM 10 MCG VAGINAL INSE: 10 | 28 days supply | Qty: 18 | Fill #0

## 2016-12-05 MED FILL — HYDROCHLOROTHIAZIDE 25 MG T: 25 | 90 days supply | Qty: 90 | Fill #0

## 2016-12-16 ENCOUNTER — Encounter (HOSPITAL_COMMUNITY): Payer: Self-pay | Admitting: Emergency Medicine

## 2016-12-16 ENCOUNTER — Emergency Department (HOSPITAL_COMMUNITY)
Admission: EM | Admit: 2016-12-16 | Discharge: 2016-12-16 | Disposition: A | Discharge status: Home / Self Care | Payer: 59 | Attending: Emergency Medicine | Admitting: Emergency Medicine

## 2016-12-16 DIAGNOSIS — F17210 Nicotine dependence, cigarettes, uncomplicated: Secondary | ICD-10-CM | POA: Insufficient documentation

## 2016-12-16 DIAGNOSIS — J45909 Unspecified asthma, uncomplicated: Secondary | ICD-10-CM | POA: Diagnosis not present

## 2016-12-16 DIAGNOSIS — R112 Nausea with vomiting, unspecified: Secondary | ICD-10-CM | POA: Insufficient documentation

## 2016-12-16 DIAGNOSIS — R197 Diarrhea, unspecified: Secondary | ICD-10-CM | POA: Insufficient documentation

## 2016-12-16 LAB — COMPREHENSIVE METABOLIC PANEL
ALT: 17 U/L (ref 14–54)
ANION GAP: 10 (ref 5–15)
AST: 22 U/L (ref 15–41)
Albumin: 4 g/dL (ref 3.5–5.0)
Alkaline Phosphatase: 68 U/L (ref 38–126)
BUN: 12 mg/dL (ref 6–20)
CO2: 25 mmol/L (ref 22–32)
CREATININE: 0.7 mg/dL (ref 0.44–1.00)
Calcium: 9.3 mg/dL (ref 8.9–10.3)
Chloride: 105 mmol/L (ref 101–111)
Glucose, Bld: 115 mg/dL — ABNORMAL HIGH (ref 65–99)
Potassium: 3.8 mmol/L (ref 3.5–5.1)
Sodium: 140 mmol/L (ref 135–145)
Total Bilirubin: 0.5 mg/dL (ref 0.3–1.2)
Total Protein: 6.5 g/dL (ref 6.5–8.1)

## 2016-12-16 LAB — URINALYSIS, ROUTINE W REFLEX MICROSCOPIC
Bilirubin Urine: NEGATIVE
GLUCOSE, UA: NEGATIVE mg/dL
Hgb urine dipstick: NEGATIVE
Ketones, ur: NEGATIVE mg/dL
LEUKOCYTES UA: NEGATIVE
Nitrite: NEGATIVE
Protein, ur: NEGATIVE mg/dL
Specific Gravity, Urine: 1.015 (ref 1.005–1.030)
pH: 8 (ref 5.0–8.0)

## 2016-12-16 LAB — LIPASE, BLOOD: Lipase: 37 U/L (ref 11–51)

## 2016-12-16 LAB — CBC
HCT: 41.5 % (ref 36.0–46.0)
HEMOGLOBIN: 14.6 g/dL (ref 12.0–15.0)
MCH: 33.7 pg (ref 26.0–34.0)
MCHC: 35.2 g/dL (ref 30.0–36.0)
MCV: 95.8 fL (ref 78.0–100.0)
Platelets: 245 10*3/uL (ref 150–400)
RBC: 4.33 MIL/uL (ref 3.87–5.11)
RDW: 12.7 % (ref 11.5–15.5)
WBC: 4 10*3/uL (ref 4.0–10.5)

## 2016-12-16 MED ORDER — ONDANSETRON HCL 4 MG/2ML IJ SOLN
4.0000 mg | Freq: Once | INTRAMUSCULAR | Status: DC
  Filled 2016-12-16: qty 2

## 2016-12-16 MED ORDER — SODIUM CHLORIDE 0.9 % IV BOLUS (SEPSIS): 1000.0000 mL | Freq: Once | INTRAVENOUS | Status: DC

## 2016-12-16 MED ORDER — ONDANSETRON 4 MG PO TBDP
4.0000 mg | ORAL_TABLET | Freq: Once | ORAL | Status: AC
  Administered 2016-12-16: 4 mg via ORAL
  Filled 2016-12-16: qty 1

## 2016-12-16 MED ORDER — ONDANSETRON 4 MG PO TBDP: 4.0000 mg | ORAL_TABLET | Freq: Three times a day (TID) | ORAL | 0 refills | Status: DC | PRN

## 2016-12-16 MED ORDER — DIPHENOXYLATE-ATROPINE 2.5-0.025 MG PO TABS: 1.0000 | ORAL_TABLET | Freq: Four times a day (QID) | ORAL | 0 refills | Status: DC | PRN

## 2016-12-16 MED ORDER — DIPHENOXYLATE-ATROPINE 2.5-0.025 MG PO TABS
2.0000 | ORAL_TABLET | Freq: Once | ORAL | Status: AC
  Administered 2016-12-16: 2 via ORAL
  Filled 2016-12-16: qty 2

## 2016-12-16 NOTE — Discharge Instructions (Signed)
Clear liquids. Slowly advance your diet. Zofran for nausea. Lomotil for diarrhea.

## 2016-12-16 NOTE — ED Triage Notes (Signed)
Pt c/o went to [facility] and returned on Monday. Thursday night pt began to experience abdominal pain with N/V/D.

## 2016-12-16 NOTE — ED Notes (Signed)
Pt declined IV meds requesting oral. Dr Jeneen Rinks made aware

## 2016-12-20 NOTE — ED Provider Notes (Signed)
[facility] DEPT Provider Note   CSN: [id number] Arrival date & time: 12/16/16  1630     History   Chief Complaint Chief Complaint  Patient presents with  . Abdominal Pain  . Nausea  . Emesis  . Diarrhea    HPI Brianna Jenkins is a 56 y.o. female.Pt traveled to [place]. Returned a few days gol N/V/D since. No fever. Minimal cramping. HNNB emesis. NO blood or mucus in stool.  HPI  Past Medical History:  Diagnosis Date  . Asthma     Patient Active Problem List   Diagnosis Date Noted  . Spider veins of both lower extremities 10/18/2015  . Tobacco dependence 02/15/2015  . Prehypertension 02/15/2015  . INSOMNIA 07/07/2010  . VARICOSE VEINS, LOWER EXTREMITIES 02/17/2010  . Asthma 02/17/2010  . PAP SMEAR, ABNORMAL, ASCUS 02/07/2009  . FIBROIDS, UTERUS 11/11/1998    Past Surgical History:  Procedure Laterality Date  . ECTOPIC PREGNANCY SURGERY     x 2     OB History    No data available       Home Medications    Prior to Admission medications   Medication Sig Start Date End Date Taking? Authorizing Provider  albuterol (PROVENTIL HFA;VENTOLIN HFA) 108 (90 BASE) MCG/ACT inhaler Inhale 2 puffs into the lungs every 6 (six) hours as needed for wheezing.    [provider]  diphenoxylate-atropine (LOMOTIL) 2.5-0.025 MG tablet Take 1 tablet by mouth 4 (four) times daily as needed for diarrhea or loose stools. 12/16/16   Tanna Furry, MD  hydrochlorothiazide (HYDRODIURIL) 25 MG tablet Take 25 mg by mouth daily.    [provider]  ondansetron (ZOFRAN ODT) 4 MG disintegrating tablet Take 1 tablet (4 mg total) by mouth every 8 (eight) hours as needed for nausea. 12/16/16   Tanna Furry, MD  varenicline (CHANTIX CONTINUING MONTH PAK) 1 MG tablet Take 1/2 tablet daily for 3 days, then twice a day for  4 days, the start one tablet bid. Patient not taking: Reported on 10/18/2015 05/19/15   Micheline Chapman, NP    Family History Family History  Problem Relation  Age of Onset  . Hyperlipidemia Mother   . Depression Mother   . Depression Maternal Aunt     Social History Social History  Substance Use Topics  . Smoking status: Current Every Day Smoker    Packs/day: 1.00    Types: Cigarettes  . Smokeless tobacco: Never Used  . Alcohol use 1.2 oz/week    2 Glasses of wine per week     Allergies   Patient has no known allergies.   Review of Systems Review of Systems  Constitutional: Negative for appetite change, chills, diaphoresis, fatigue and fever.  HENT: Negative for mouth sores, sore throat and trouble swallowing.   Eyes: Negative for visual disturbance.  Respiratory: Negative for cough, chest tightness, shortness of breath and wheezing.   Cardiovascular: Negative for chest pain.  Gastrointestinal: Positive for abdominal pain, diarrhea, nausea and vomiting. Negative for abdominal distention.  Endocrine: Negative for polydipsia, polyphagia and polyuria.  Genitourinary: Negative for dysuria, frequency and hematuria.  Musculoskeletal: Negative for gait problem.  Skin: Negative for color change, pallor and rash.  Neurological: Negative for dizziness, syncope, light-headedness and headaches.  Hematological: Does not bruise/bleed easily.  Psychiatric/Behavioral: Negative for behavioral problems and confusion.     Physical Exam Updated Vital Signs BP 109/85   Pulse 67   Temp 98.6 F (37 C) (Oral)   Resp 16   Ht 5'  7" (1.702 m)   Wt 73.5 kg (162 lb)   SpO2 100%   BMI 25.37 kg/m   Physical Exam  Constitutional: She is oriented to person, place, and time. She appears well-developed and well-nourished. No distress.  HENT:  Head: Normocephalic.  Eyes: Pupils are equal, round, and reactive to light. Conjunctivae are normal. No scleral icterus.  Neck: Normal range of motion. Neck supple. No thyromegaly present.  Cardiovascular: Normal rate and regular rhythm.  Exam reveals no gallop and no friction rub.   No murmur  heard. Pulmonary/Chest: Effort normal and breath sounds normal. No respiratory distress. She has no wheezes. She has no rales.  Abdominal: Soft. Bowel sounds are normal. She exhibits no distension. There is no tenderness. There is no rebound.  Musculoskeletal: Normal range of motion.  Neurological: She is alert and oriented to person, place, and time.  Skin: Skin is warm and dry. No rash noted.  Psychiatric: She has a normal mood and affect. Her behavior is normal.     ED Treatments / Results  Labs (all labs ordered are listed, but only abnormal results are displayed) Labs Reviewed  COMPREHENSIVE METABOLIC PANEL - Abnormal; Notable for the following:       Result Value   Glucose, Bld 115 (*)    All other components within normal limits  URINALYSIS, ROUTINE W REFLEX MICROSCOPIC - Abnormal; Notable for the following:    APPearance HAZY (*)    All other components within normal limits  LIPASE, BLOOD  CBC    EKG  EKG Interpretation None       Radiology No results found.  Procedures Procedures (including critical care time)  Medications Ordered in ED Medications  diphenoxylate-atropine (LOMOTIL) 2.5-0.025 MG per tablet 2 tablet (2 tablets Oral Given 12/16/16 2015)  ondansetron (ZOFRAN-ODT) disintegrating tablet 4 mg (4 mg Oral Given 12/16/16 2034)     Initial Impression / Assessment and Plan / ED Course  I have reviewed the triage vital signs and the nursing notes.  Pertinent labs & imaging results that were available during my care of the patient were reviewed by me and considered in my medical decision making (see chart for details).    Declines IV/Meds here. Given PO zofran and Lomotil. OK for DC  Final Clinical Impressions(s) / ED Diagnoses   Final diagnoses:  Nausea vomiting and diarrhea    New Prescriptions Discharge Medication List as of 12/16/2016  8:29 PM    START taking these medications   Details  diphenoxylate-atropine (LOMOTIL) 2.5-0.025 MG tablet  Take 1 tablet by mouth 4 (four) times daily as needed for diarrhea or loose stools., Starting Sun 12/16/2016, Print    ondansetron (ZOFRAN ODT) 4 MG disintegrating tablet Take 1 tablet (4 mg total) by mouth every 8 (eight) hours as needed for nausea., Starting Sun 12/16/2016, Print         Tanna Furry, MD 12/20/16 [phone]

## 2017-03-07 DIAGNOSIS — Z720 Tobacco use: Secondary | ICD-10-CM | POA: Diagnosis not present

## 2017-03-07 DIAGNOSIS — M25561 Pain in right knee: Secondary | ICD-10-CM | POA: Diagnosis not present

## 2017-03-07 DIAGNOSIS — Z23 Encounter for immunization: Secondary | ICD-10-CM | POA: Diagnosis not present

## 2017-03-07 DIAGNOSIS — M2550 Pain in unspecified joint: Secondary | ICD-10-CM | POA: Diagnosis not present

## 2017-03-07 DIAGNOSIS — L989 Disorder of the skin and subcutaneous tissue, unspecified: Secondary | ICD-10-CM | POA: Diagnosis not present

## 2017-03-07 DIAGNOSIS — L84 Corns and callosities: Secondary | ICD-10-CM | POA: Diagnosis not present

## 2017-03-07 MED FILL — BUPROPION SR 150 MG TABLET: 150 | 30 days supply | Qty: 60 | Fill #0

## 2017-03-07 MED FILL — HYDROCHLOROTHIAZIDE 25 MG T: 25 | 90 days supply | Qty: 90 | Fill #1

## 2017-03-07 MED FILL — MELOXICAM 15 MG TABLET: 15 | 30 days supply | Qty: 30 | Fill #0

## 2017-03-07 MED FILL — AMLODIPINE BESYLATE 5 MG TA: 5 | 90 days supply | Qty: 90 | Fill #0

## 2017-03-18 ENCOUNTER — Ambulatory Visit (INDEPENDENT_AMBULATORY_CARE_PROVIDER_SITE_OTHER): Payer: 59 | Admitting: Podiatry

## 2017-03-18 ENCOUNTER — Ambulatory Visit (INDEPENDENT_AMBULATORY_CARE_PROVIDER_SITE_OTHER): Payer: 59

## 2017-03-18 DIAGNOSIS — M21619 Bunion of unspecified foot: Secondary | ICD-10-CM

## 2017-03-18 DIAGNOSIS — L989 Disorder of the skin and subcutaneous tissue, unspecified: Secondary | ICD-10-CM | POA: Diagnosis not present

## 2017-03-19 NOTE — Progress Notes (Signed)
   Subjective: Patient presents to the office today for chief complaint of a painful callus lesion of the left foot. Patient states that the pain is ongoing and is affecting their ability to ambulate without pain. Patient presents today for further treatment and evaluation.  Objective:  Physical Exam General: Alert and oriented x3 in no acute distress  Dermatology: Hyperkeratotic lesion present to the 4th interspace of the left foot. Pain on palpation with a central nucleated core noted.  Skin is warm, dry and supple bilateral lower extremities. Negative for open lesions or macerations.  Vascular: Palpable pedal pulses bilaterally. No edema or erythema noted. Capillary refill within normal limits.  Neurological: Epicritic and protective threshold grossly intact bilaterally.   Musculoskeletal Exam: Pain on palpation at the keratotic lesion noted. Range of motion within normal limits bilateral. Muscle strength 5/5 in all groups bilateral.  Radiographic Exam:  Normal osseous mineralization. Joint spaces preserved. No fracture/dislocation/boney destruction.   Assessment: #1 Heloma molle left fourth interspace   Plan of Care:  #1 Patient evaluated. X-rays reviewed. #2 Excisional debridement of keratoic lesion using a chisel blade was performed without incident.  #3 Dressed area with light dressing. #4 silicone toe caps dispensed. #5 Patient is to return to the clinic PRN.    Edrick Kins, DPM [facility]  Dr. Edrick Kins, [address]                                        [place], [place] [address]                Office [phone]  Fax [phone]

## 2017-03-28 MED FILL — PANTOPRAZOLE SOD DR 20 MG T: 20 | 90 days supply | Qty: 90 | Fill #0

## 2017-04-22 ENCOUNTER — Ambulatory Visit (HOSPITAL_COMMUNITY)
Admission: EM | Admit: 2017-04-22 | Discharge: 2017-04-22 | Disposition: A | Discharge status: Home / Self Care | Payer: 59 | Attending: Emergency Medicine | Admitting: Emergency Medicine

## 2017-04-22 ENCOUNTER — Ambulatory Visit (INDEPENDENT_AMBULATORY_CARE_PROVIDER_SITE_OTHER): Payer: 59

## 2017-04-22 ENCOUNTER — Encounter (HOSPITAL_COMMUNITY): Payer: Self-pay | Admitting: Emergency Medicine

## 2017-04-22 ENCOUNTER — Other Ambulatory Visit: Payer: Self-pay

## 2017-04-22 DIAGNOSIS — S99921A Unspecified injury of right foot, initial encounter: Secondary | ICD-10-CM | POA: Diagnosis not present

## 2017-04-22 DIAGNOSIS — S90121A Contusion of right lesser toe(s) without damage to nail, initial encounter: Secondary | ICD-10-CM

## 2017-04-22 NOTE — Discharge Instructions (Addendum)
Ice, elevate. Limit bending the toe.

## 2017-04-22 NOTE — ED Notes (Signed)
Pt given supplies home to buddy tape toes

## 2017-04-22 NOTE — ED Provider Notes (Signed)
[facility]    CSN: [id number] Arrival date & time: 04/22/17  1702     History   Chief Complaint Chief Complaint  Patient presents with  . Foot Pain    HPI Brianna Jenkins is a 56 y.o. female.   56 year old female states she awoke around 4:30 AM 3 days ago and stubbed her right second toe on the base of the bed. She is complaining of pain, swelling and discoloration to the second toe of the right foot.      Past Medical History:  Diagnosis Date  . Asthma     Patient Active Problem List   Diagnosis Date Noted  . Spider veins of both lower extremities 10/18/2015  . Tobacco dependence 02/15/2015  . Prehypertension 02/15/2015  . INSOMNIA 07/07/2010  . VARICOSE VEINS, LOWER EXTREMITIES 02/17/2010  . Asthma 02/17/2010  . PAP SMEAR, ABNORMAL, ASCUS 02/07/2009  . FIBROIDS, UTERUS 11/11/1998    Past Surgical History:  Procedure Laterality Date  . ECTOPIC PREGNANCY SURGERY     x 2     OB History    No data available       Home Medications    Prior to Admission medications   Medication Sig Start Date End Date Taking? Authorizing Provider  albuterol (PROVENTIL HFA;VENTOLIN HFA) 108 (90 BASE) MCG/ACT inhaler Inhale 2 puffs into the lungs every 6 (six) hours as needed for wheezing.    [provider]  amLODipine (NORVASC) 5 MG tablet  03/07/17   [provider]  buPROPion (ZYBAN) 150 MG 12 hr tablet  03/07/17   [provider]  hydrochlorothiazide (HYDRODIURIL) 25 MG tablet Take 25 mg by mouth daily.    [provider]  meloxicam (MOBIC) 15 MG tablet  03/07/17   [provider]  pantoprazole (PROTONIX) 20 MG tablet  03/07/17   [provider]    Family History Family History  Problem Relation Age of Onset  . Hyperlipidemia Mother   . Depression Mother   . Depression Maternal Aunt     Social History Social History   Tobacco Use  . Smoking status: Current Every Day Smoker    Packs/day: 1.00   Types: Cigarettes  . Smokeless tobacco: Never Used  Substance Use Topics  . Alcohol use: Yes    Alcohol/week: 1.2 oz    Types: 2 Glasses of wine per week  . Drug use: No     Allergies   Patient has no known allergies.   Review of Systems Review of Systems  Constitutional: Negative for activity change, chills and fever.  HENT: Negative.   Respiratory: Negative.   Cardiovascular: Negative.   Musculoskeletal:       As per HPI  Skin: Negative for color change, pallor and rash.  Neurological: Negative.   All other systems reviewed and are negative.    Physical Exam Triage Vital Signs ED Triage Vitals  Enc Vitals Group     BP 04/22/17 1732 (!) 143/90     Pulse Rate 04/22/17 1732 85     Resp 04/22/17 1732 18     Temp 04/22/17 1732 98.2 F (36.8 C)     Temp Source 04/22/17 1732 Oral     SpO2 04/22/17 1732 100 %     Weight --      Height --      Head Circumference --      Peak Flow --      Pain Score 04/22/17 1725 7     Pain Loc --  Pain Edu? --      Excl. in [place]? --    No data found.  Updated Vital Signs BP (!) 143/90 (BP Location: Left Arm)   Pulse 85   Temp 98.2 F (36.8 C) (Oral)   Resp 18   SpO2 100%   Visual Acuity Right Eye Distance:   Left Eye Distance:   Bilateral Distance:    Right Eye Near:   Left Eye Near:    Bilateral Near:     Physical Exam  Constitutional: She is oriented to person, place, and time. She appears well-developed and well-nourished. No distress.  HENT:  Head: Normocephalic and atraumatic.  Eyes: EOM are normal. Pupils are equal, round, and reactive to light.  Neck: Neck supple.  Musculoskeletal:  Right second toe with purplish discoloration. No deformity. Minor swelling and positive for tenderness.  Lymphadenopathy:    She has no cervical adenopathy.  Neurological: She is alert and oriented to person, place, and time. No cranial nerve deficit.  Skin: Skin is warm and dry.     UC Treatments / Results  Labs (all  labs ordered are listed, but only abnormal results are displayed) Labs Reviewed - No data to display  EKG  EKG Interpretation None       Radiology Dg Foot Complete Right  Result Date: 04/22/2017 CLINICAL DATA:  Injury pain at the second digit EXAM: RIGHT FOOT COMPLETE - 3+ VIEW COMPARISON:  03/18/2017 FINDINGS: There is no evidence of fracture or dislocation. There is no evidence of arthropathy or other focal bone abnormality. Soft tissues are unremarkable. IMPRESSION: Negative. Electronically Signed   By: Donavan Foil M.D.   On: 04/22/2017 17:54    Procedures Procedures (including critical care time)  Medications Ordered in UC Medications - No data to display   Initial Impression / Assessment and Plan / UC Course  I have reviewed the triage vital signs and the nursing notes.  Pertinent labs & imaging results that were available during my care of the patient were reviewed by me and considered in my medical decision making (see chart for details).    Ice, elevate. Limit bending the toe.     Final Clinical Impressions(s) / UC Diagnoses   Final diagnoses:  Contusion of second toe of right foot, initial encounter    ED Discharge Orders    None       Controlled Substance Prescriptions [place] Controlled Substance Registry consulted? Not Applicable   Janne Napoleon, NP 04/22/17 6063

## 2017-04-22 NOTE — ED Triage Notes (Signed)
Right foot pain.  At 4 am Friday morning, kicked leg of bed with impact in between right great toe and next toe-impact between the two toes

## 2017-05-13 MED FILL — PROAIR RESPICLICK INHAL PWD: 108 (90 BAS | 30 days supply | Qty: 1 | Fill #0

## 2017-05-13 MED FILL — BUPROPION SR 150 MG TABLET: 150 | 30 days supply | Qty: 60 | Fill #1

## 2017-10-21 DIAGNOSIS — Z1231 Encounter for screening mammogram for malignant neoplasm of breast: Secondary | ICD-10-CM | POA: Diagnosis not present

## 2017-10-21 DIAGNOSIS — Z6825 Body mass index (BMI) 25.0-25.9, adult: Secondary | ICD-10-CM | POA: Diagnosis not present

## 2017-10-21 DIAGNOSIS — Z01419 Encounter for gynecological examination (general) (routine) without abnormal findings: Secondary | ICD-10-CM | POA: Diagnosis not present

## 2017-10-21 DIAGNOSIS — Z1151 Encounter for screening for human papillomavirus (HPV): Secondary | ICD-10-CM | POA: Diagnosis not present

## 2017-10-21 DIAGNOSIS — R87612 Low grade squamous intraepithelial lesion on cytologic smear of cervix (LGSIL): Secondary | ICD-10-CM | POA: Diagnosis not present

## 2017-10-21 DIAGNOSIS — R87610 Atypical squamous cells of undetermined significance on cytologic smear of cervix (ASC-US): Secondary | ICD-10-CM | POA: Diagnosis not present

## 2017-11-01 DIAGNOSIS — J45909 Unspecified asthma, uncomplicated: Secondary | ICD-10-CM | POA: Diagnosis not present

## 2017-11-01 DIAGNOSIS — L989 Disorder of the skin and subcutaneous tissue, unspecified: Secondary | ICD-10-CM | POA: Diagnosis not present

## 2017-11-01 DIAGNOSIS — J069 Acute upper respiratory infection, unspecified: Secondary | ICD-10-CM | POA: Diagnosis not present

## 2017-11-01 DIAGNOSIS — F17200 Nicotine dependence, unspecified, uncomplicated: Secondary | ICD-10-CM | POA: Diagnosis not present

## 2017-11-01 DIAGNOSIS — I10 Essential (primary) hypertension: Secondary | ICD-10-CM | POA: Diagnosis not present

## 2017-11-01 MED FILL — AZITHROMYCIN 250 MG TABS: 250 | 5 days supply | Qty: 6 | Fill #0

## 2017-11-01 MED FILL — ESTRADIOL 10 MCG TABS: 10 | 28 days supply | Qty: 18 | Fill #0

## 2017-11-01 MED FILL — predniSONE 10 MG (21) TBPK: 10 | 6 days supply | Qty: 21 | Fill #0

## 2017-11-05 DIAGNOSIS — H524 Presbyopia: Secondary | ICD-10-CM | POA: Diagnosis not present

## 2018-01-21 DIAGNOSIS — L821 Other seborrheic keratosis: Secondary | ICD-10-CM | POA: Diagnosis not present

## 2018-01-21 DIAGNOSIS — L720 Epidermal cyst: Secondary | ICD-10-CM | POA: Diagnosis not present

## 2018-01-21 DIAGNOSIS — L820 Inflamed seborrheic keratosis: Secondary | ICD-10-CM | POA: Diagnosis not present

## 2018-02-03 MED FILL — PANTOPRAZOLE SOD DR 20 MG T: 20 | 90 days supply | Qty: 90 | Fill #1

## 2018-02-03 MED FILL — AMLODIPINE BESYLATE 5 MG TA: 5 | 90 days supply | Qty: 90 | Fill #0

## 2018-02-03 MED FILL — HYDROCHLOROTHIAZIDE 25 MG T: 25 | 90 days supply | Qty: 90 | Fill #0

## 2018-04-11 DIAGNOSIS — I10 Essential (primary) hypertension: Secondary | ICD-10-CM | POA: Diagnosis not present

## 2018-04-11 DIAGNOSIS — D3A026 Benign carcinoid tumor of the rectum: Secondary | ICD-10-CM | POA: Diagnosis not present

## 2018-04-11 DIAGNOSIS — Z0000 Encounter for general adult medical examination without abnormal findings: Secondary | ICD-10-CM | POA: Diagnosis not present

## 2018-04-11 DIAGNOSIS — F17210 Nicotine dependence, cigarettes, uncomplicated: Secondary | ICD-10-CM | POA: Diagnosis not present

## 2018-04-11 DIAGNOSIS — L989 Disorder of the skin and subcutaneous tissue, unspecified: Secondary | ICD-10-CM | POA: Diagnosis not present

## 2018-04-11 MED FILL — CHANTIX STARTING MONTH BOX: 0.5 MG X 11 | 30 days supply | Qty: 53 | Fill #0

## 2018-05-20 MED FILL — AMLODIPINE BESYLATE 5 MG TA: 5 | 90 days supply | Qty: 90 | Fill #0

## 2018-05-20 MED FILL — HYDROCHLOROTHIAZIDE 25 MG T: 25 | 90 days supply | Qty: 90 | Fill #0

## 2018-05-20 MED FILL — SCOPOLAMINE 1 MG/3DAYS PT72: 1 | 9 days supply | Qty: 3 | Fill #0

## 2018-06-15 DIAGNOSIS — J45901 Unspecified asthma with (acute) exacerbation: Secondary | ICD-10-CM | POA: Diagnosis not present

## 2018-07-08 MED FILL — PROAIR RESPICLICK INHAL PWD: 108 (90 BAS | 25 days supply | Qty: 1 | Fill #0

## 2018-07-28 ENCOUNTER — Ambulatory Visit (INDEPENDENT_AMBULATORY_CARE_PROVIDER_SITE_OTHER): Payer: 59

## 2018-07-28 ENCOUNTER — Ambulatory Visit (HOSPITAL_COMMUNITY)
Admission: EM | Admit: 2018-07-28 | Discharge: 2018-07-28 | Disposition: A | Discharge status: Home / Self Care | Payer: 59 | Attending: Internal Medicine | Admitting: Internal Medicine

## 2018-07-28 ENCOUNTER — Encounter (HOSPITAL_COMMUNITY): Payer: Self-pay

## 2018-07-28 DIAGNOSIS — R062 Wheezing: Secondary | ICD-10-CM

## 2018-07-28 DIAGNOSIS — J4521 Mild intermittent asthma with (acute) exacerbation: Secondary | ICD-10-CM

## 2018-07-28 DIAGNOSIS — J22 Unspecified acute lower respiratory infection: Secondary | ICD-10-CM

## 2018-07-28 DIAGNOSIS — R0602 Shortness of breath: Secondary | ICD-10-CM

## 2018-07-28 DIAGNOSIS — R05 Cough: Secondary | ICD-10-CM

## 2018-07-28 MED ORDER — BENZONATATE 100 MG PO CAPS: 200.0000 mg | ORAL_CAPSULE | Freq: Three times a day (TID) | ORAL | 0 refills | Status: DC | PRN

## 2018-07-28 MED ORDER — HYDROCODONE-HOMATROPINE 5-1.5 MG/5ML PO SYRP: 5.0000 mL | ORAL_SOLUTION | Freq: Every evening | ORAL | 0 refills | Status: DC | PRN

## 2018-07-28 MED ORDER — AZITHROMYCIN 250 MG PO TABS: ORAL_TABLET | ORAL | 0 refills | Status: AC

## 2018-07-28 MED ORDER — PREDNISONE 20 MG PO TABS: 40.0000 mg | ORAL_TABLET | Freq: Every day | ORAL | 0 refills | Status: AC

## 2018-07-28 MED FILL — HYDROCODONE-HOMATROPINE SYR: 5-1.5 | 10 days supply | Qty: 50 | Fill #0

## 2018-07-28 MED FILL — predniSONE 20 MG TABS: 20 | 5 days supply | Qty: 10 | Fill #0

## 2018-07-28 MED FILL — BENZONATATE 100 MG CAP: 100 | 4 days supply | Qty: 21 | Fill #0

## 2018-07-28 MED FILL — AZITHROMYCIN 250 MG TABLET: 250 | 5 days supply | Qty: 6 | Fill #0

## 2018-07-28 NOTE — ED Triage Notes (Signed)
Pt presents with persistent non productive cough, asthma flare up ( having to use inhaler more than usual), and shortness of breath X 7 days.

## 2018-07-28 NOTE — Discharge Instructions (Signed)
Push fluids to ensure adequate hydration and keep secretions thin.  Tylenol and/or ibuprofen as needed for pain or fevers.  Complete course of antibiotics.  5 days of prednisone. Cough syrup as needed at night. May cause drowsiness. Please do not take if driving or drinking alcohol.   If symptoms worsen or do not improve in the next week to return to be seen or to follow up with PCP.

## 2018-07-29 NOTE — ED Provider Notes (Signed)
[facility]    CSN: [id number] Arrival date & time: 07/28/18  1344     History   Chief Complaint Chief Complaint  Patient presents with  . Cough  . Asthma  . Shortness of Breath    HPI Brianna Jenkins is a 58 y.o. female.   Brianna Jenkins presents with complaints of persistent cough, wheezing and shortness of breath . Started three weeks ago, was productive, but now it is a dry cough. Causes her ribs to be sore from coughing. Worsened over the past few days. Has been using her pro air inhaler which only briefly helps. Hard time sleeping due to cough. Has been trying OTC cough medications as well which have minimally helped. No known ill contacts. She does have asthma and smokes, trying to quit. No known fevers. No gi/gu complaints. No chest pain.    ROS per HPI.      Past Medical History:  Diagnosis Date  . Asthma     Patient Active Problem List   Diagnosis Date Noted  . Spider veins of both lower extremities 10/18/2015  . Tobacco dependence 02/15/2015  . Prehypertension 02/15/2015  . INSOMNIA 07/07/2010  . VARICOSE VEINS, LOWER EXTREMITIES 02/17/2010  . Asthma 02/17/2010  . PAP SMEAR, ABNORMAL, ASCUS 02/07/2009  . FIBROIDS, UTERUS 11/11/1998    Past Surgical History:  Procedure Laterality Date  . ECTOPIC PREGNANCY SURGERY     x 2     OB History   No obstetric history on file.      Home Medications    Prior to Admission medications   Medication Sig Start Date End Date Taking? Authorizing Provider  albuterol (PROVENTIL HFA;VENTOLIN HFA) 108 (90 BASE) MCG/ACT inhaler Inhale 2 puffs into the lungs every 6 (six) hours as needed for wheezing.    [provider]  amLODipine (NORVASC) 5 MG tablet  03/07/17   [provider]  azithromycin (ZITHROMAX) 250 MG tablet Take 2 tablets (500 mg total) by mouth daily for 1 day, THEN 1 tablet (250 mg total) daily for 4 days. 07/28/18 08/02/18  Zigmund Gottron, NP  benzonatate (TESSALON) 100 MG capsule  Take 2 capsules (200 mg total) by mouth 3 (three) times daily as needed for cough. 07/28/18   Zigmund Gottron, NP  buPROPion (ZYBAN) 150 MG 12 hr tablet  03/07/17   [provider]  hydrochlorothiazide (HYDRODIURIL) 25 MG tablet Take 25 mg by mouth daily.    [provider]  HYDROcodone-homatropine (HYCODAN) 5-1.5 MG/5ML syrup Take 5 mLs by mouth at bedtime as needed for cough. 07/28/18   Zigmund Gottron, NP  meloxicam (MOBIC) 15 MG tablet  03/07/17   [provider]  pantoprazole (PROTONIX) 20 MG tablet  03/07/17   [provider]  predniSONE (DELTASONE) 20 MG tablet Take 2 tablets (40 mg total) by mouth daily with breakfast for 5 days. 07/28/18 08/02/18  Zigmund Gottron, NP    Family History Family History  Problem Relation Age of Onset  . Hyperlipidemia Mother   . Depression Mother   . Depression Maternal Aunt     Social History Social History   Tobacco Use  . Smoking status: Current Every Day Smoker    Packs/day: 1.00    Types: Cigarettes  . Smokeless tobacco: Never Used  Substance Use Topics  . Alcohol use: Yes    Alcohol/week: 2.0 standard drinks    Types: 2 Glasses of wine per week  . Drug use: No     Allergies  Patient has no known allergies.   Review of Systems Review of Systems   Physical Exam Triage Vital Signs ED Triage Vitals  Enc Vitals Group     BP 07/28/18 1427 (!) 135/92     Pulse Rate 07/28/18 1427 95     Resp 07/28/18 1427 16     Temp 07/28/18 1427 98.8 F (37.1 C)     Temp Source 07/28/18 1427 Oral     SpO2 07/28/18 1427 98 %     Weight --      Height --      Head Circumference --      Peak Flow --      Pain Score 07/28/18 1548 0     Pain Loc --      Pain Edu? --      Excl. in [place]? --    No data found.  Updated Vital Signs BP (!) 135/92 (BP Location: Right Arm)   Pulse 95   Temp 98.8 F (37.1 C) (Oral)   Resp 16   SpO2 98%    Physical Exam Constitutional:      General: She is not in acute  distress.    Appearance: She is well-developed.  HENT:     Head: Normocephalic and atraumatic.     Right Ear: Tympanic membrane, ear canal and external ear normal.     Left Ear: Tympanic membrane, ear canal and external ear normal.     Nose: Nose normal.     Mouth/Throat:     Pharynx: Uvula midline.     Tonsils: No tonsillar exudate.  Eyes:     Conjunctiva/sclera: Conjunctivae normal.     Pupils: Pupils are equal, round, and reactive to light.  Cardiovascular:     Rate and Rhythm: Normal rate and regular rhythm.     Heart sounds: Normal heart sounds.  Pulmonary:     Effort: Pulmonary effort is normal.     Breath sounds: Wheezing present.     Comments: Wheezing throughout, frequent dry cough noted; no increased work of breathing noted Skin:    General: Skin is warm and dry.  Neurological:     Mental Status: She is alert and oriented to person, place, and time.      UC Treatments / Results  Labs (all labs ordered are listed, but only abnormal results are displayed) Labs Reviewed - No data to display  EKG None  Radiology Dg Chest 2 View  Result Date: 07/28/2018 CLINICAL DATA:  Cough, shortness of breath and wheezing for 2 days. EXAM: CHEST - 2 VIEW COMPARISON:  11/20/2013 FINDINGS: The cardiac silhouette, mediastinal and hilar contours are within normal limits and stable. The lungs are clear. No pleural effusion. The bony thorax is intact. IMPRESSION: No acute cardiopulmonary findings. Electronically Signed   By: Marijo Sanes M.D.   On: 07/28/2018 15:17    Procedures Procedures (including critical care time)  Medications Ordered in UC Medications - No data to display  Initial Impression / Assessment and Plan / UC Course  I have reviewed the triage vital signs and the nursing notes.  Pertinent labs & imaging results that were available during my care of the patient were reviewed by me and considered in my medical decision making (see chart for details).      Persistent dry cough in this asthmatic patient with wheezing, normal chest xray. Will cover for atypicals with azithromycin, prednisone provided, as well as cough syrup for night time use. May cause drowsiness. Please do  not take if driving or drinking alcohol. Return precautions provided.  If symptoms worsen or do not improve in the next week to return to be seen or to follow up with PCP.  Patient verbalized understanding and agreeable to plan.   Final Clinical Impressions(s) / UC Diagnoses   Final diagnoses:  Mild intermittent asthma with exacerbation  Lower respiratory tract infection     Discharge Instructions     Push fluids to ensure adequate hydration and keep secretions thin.  Tylenol and/or ibuprofen as needed for pain or fevers.  Complete course of antibiotics.  5 days of prednisone. Cough syrup as needed at night. May cause drowsiness. Please do not take if driving or drinking alcohol.   If symptoms worsen or do not improve in the next week to return to be seen or to follow up with PCP.     ED Prescriptions    Medication Sig Dispense Auth. Provider   predniSONE (DELTASONE) 20 MG tablet Take 2 tablets (40 mg total) by mouth daily with breakfast for 5 days. 10 tablet Augusto Gamble B, NP   azithromycin (ZITHROMAX) 250 MG tablet Take 2 tablets (500 mg total) by mouth daily for 1 day, THEN 1 tablet (250 mg total) daily for 4 days. 6 tablet Augusto Gamble B, NP   HYDROcodone-homatropine (HYCODAN) 5-1.5 MG/5ML syrup Take 5 mLs by mouth at bedtime as needed for cough. 50 mL Augusto Gamble B, NP   benzonatate (TESSALON) 100 MG capsule Take 2 capsules (200 mg total) by mouth 3 (three) times daily as needed for cough. 21 capsule Zigmund Gottron, NP     Controlled Substance Prescriptions [place] Controlled Substance Registry consulted? Not Applicable   Zigmund Gottron, NP 07/29/18 1421

## 2018-07-30 MED FILL — CHANTIX 1 MG TABLET: 1 | 30 days supply | Qty: 60 | Fill #0

## 2018-09-02 DIAGNOSIS — J4541 Moderate persistent asthma with (acute) exacerbation: Secondary | ICD-10-CM | POA: Diagnosis not present

## 2018-09-02 MED FILL — AZITHROMYCIN 250 MG TABLET: 250 | 5 days supply | Qty: 6 | Fill #0

## 2018-09-02 MED FILL — HYDROCHLOROTHIAZIDE 25 MG T: 25 | 90 days supply | Qty: 90 | Fill #1

## 2018-09-02 MED FILL — AMLODIPINE BESYLATE 5 MG TA: 5 | 90 days supply | Qty: 90 | Fill #1

## 2018-09-02 MED FILL — predniSONE 10 MG TABS: 10 | 6 days supply | Qty: 21 | Fill #0

## 2018-09-02 MED FILL — ALBUTEROL SUL 2.5 MG/3 ML S: (2.5 MG/3ML | 30 days supply | Qty: 540 | Fill #0

## 2018-09-12 MED FILL — PANTOPRAZOLE SOD DR 20 MG T: 20 | 90 days supply | Qty: 90 | Fill #0

## 2018-09-25 MED FILL — PROAIR RESPICLICK INHAL PWD: 108 (90 BAS | 25 days supply | Qty: 1 | Fill #1

## 2018-09-26 MED FILL — AZITHROMYCIN 250 MG TABLET: 250 | 5 days supply | Qty: 6 | Fill #0

## 2018-09-26 MED FILL — predniSONE 10 MG (21) TBPK: 10 | 6 days supply | Qty: 21 | Fill #0

## 2018-10-24 ENCOUNTER — Other Ambulatory Visit: Payer: Self-pay | Admitting: Internal Medicine

## 2018-10-24 ENCOUNTER — Ambulatory Visit
Admission: RE | Admit: 2018-10-24 | Discharge: 2018-10-24 | Disposition: A | Discharge status: Home / Self Care | Payer: 59 | Source: Ambulatory Visit | Attending: Internal Medicine | Admitting: Internal Medicine

## 2018-10-24 DIAGNOSIS — J45909 Unspecified asthma, uncomplicated: Secondary | ICD-10-CM | POA: Diagnosis not present

## 2018-10-24 DIAGNOSIS — J40 Bronchitis, not specified as acute or chronic: Secondary | ICD-10-CM | POA: Diagnosis not present

## 2018-10-24 DIAGNOSIS — R0602 Shortness of breath: Secondary | ICD-10-CM | POA: Diagnosis not present

## 2018-10-24 MED FILL — BREO ELLIPTA 100-25 MCG INH: 100-25 | 30 days supply | Qty: 60 | Fill #0

## 2018-10-24 MED FILL — predniSONE 10 MG (21) TBPK: 10 | 6 days supply | Qty: 21 | Fill #0

## 2018-10-24 MED FILL — HYDROCODONE-CHLORPHEN ER SU: 10-8 | 10 days supply | Qty: 100 | Fill #0

## 2018-12-09 DIAGNOSIS — R87612 Low grade squamous intraepithelial lesion on cytologic smear of cervix (LGSIL): Secondary | ICD-10-CM | POA: Diagnosis not present

## 2018-12-09 DIAGNOSIS — Z1231 Encounter for screening mammogram for malignant neoplasm of breast: Secondary | ICD-10-CM | POA: Diagnosis not present

## 2018-12-09 DIAGNOSIS — Z6827 Body mass index (BMI) 27.0-27.9, adult: Secondary | ICD-10-CM | POA: Diagnosis not present

## 2018-12-09 DIAGNOSIS — Z01419 Encounter for gynecological examination (general) (routine) without abnormal findings: Secondary | ICD-10-CM | POA: Diagnosis not present

## 2018-12-22 DIAGNOSIS — R87612 Low grade squamous intraepithelial lesion on cytologic smear of cervix (LGSIL): Secondary | ICD-10-CM | POA: Diagnosis not present

## 2018-12-31 DIAGNOSIS — H524 Presbyopia: Secondary | ICD-10-CM | POA: Diagnosis not present

## 2019-01-16 MED FILL — PROAIR RESPICLICK INHAL PWD: 108 (90 BAS | 25 days supply | Qty: 1 | Fill #2

## 2019-01-22 MED FILL — predniSONE 10 MG (21) TBPK: 10 | 6 days supply | Qty: 21 | Fill #0

## 2019-02-07 MED FILL — PEG-3350 SOLUTION: 420 | 1 days supply | Qty: 4000 | Fill #0

## 2019-02-12 DIAGNOSIS — Z86010 Personal history of colonic polyps: Secondary | ICD-10-CM | POA: Diagnosis not present

## 2019-02-12 DIAGNOSIS — Z9889 Other specified postprocedural states: Secondary | ICD-10-CM | POA: Diagnosis not present

## 2019-02-12 DIAGNOSIS — K5730 Diverticulosis of large intestine without perforation or abscess without bleeding: Secondary | ICD-10-CM | POA: Diagnosis not present

## 2019-02-14 ENCOUNTER — Encounter (HOSPITAL_COMMUNITY): Payer: Self-pay | Admitting: *Deleted

## 2019-02-14 ENCOUNTER — Other Ambulatory Visit: Payer: Self-pay

## 2019-02-14 ENCOUNTER — Ambulatory Visit (HOSPITAL_COMMUNITY)
Admission: EM | Admit: 2019-02-14 | Discharge: 2019-02-14 | Disposition: A | Discharge status: Home / Self Care | Payer: 59 | Attending: Emergency Medicine | Admitting: Emergency Medicine

## 2019-02-14 DIAGNOSIS — J4521 Mild intermittent asthma with (acute) exacerbation: Secondary | ICD-10-CM | POA: Insufficient documentation

## 2019-02-14 DIAGNOSIS — Z20828 Contact with and (suspected) exposure to other viral communicable diseases: Secondary | ICD-10-CM | POA: Diagnosis not present

## 2019-02-14 DIAGNOSIS — I10 Essential (primary) hypertension: Secondary | ICD-10-CM

## 2019-02-14 HISTORY — DX: Essential (primary) hypertension: I10

## 2019-02-14 HISTORY — DX: Unspecified ectopic pregnancy without intrauterine pregnancy: O00.90

## 2019-02-14 HISTORY — DX: Gastro-esophageal reflux disease without esophagitis: K21.9

## 2019-02-14 MED ORDER — METHYLPREDNISOLONE SODIUM SUCC 125 MG IJ SOLR
INTRAMUSCULAR | Status: AC
  Filled 2019-02-14: qty 2

## 2019-02-14 MED ORDER — PREDNISONE 20 MG PO TABS: 40.0000 mg | ORAL_TABLET | Freq: Every day | ORAL | 0 refills | Status: AC

## 2019-02-14 MED ORDER — METHYLPREDNISOLONE SODIUM SUCC 125 MG IJ SOLR
125.0000 mg | Freq: Once | INTRAMUSCULAR | Status: AC
  Administered 2019-02-14: 125 mg via INTRAMUSCULAR

## 2019-02-14 MED FILL — predniSONE 20 MG TABS: 20 | 5 days supply | Qty: 10 | Fill #0

## 2019-02-14 NOTE — ED Triage Notes (Signed)
C/O asthma flare-up with wheezing, unrelieved by inhalers and neb treatments at home.  Has had significant shortness of breath at times, along with cough.  Denies fevers.

## 2019-02-14 NOTE — ED Provider Notes (Signed)
[facility]    CSN: [id number] Arrival date & time: 02/14/19  1104      History   Chief Complaint Chief Complaint  Patient presents with  . Asthma  . Cough    HPI Brianna Jenkins is a 58 y.o. female.   Brianna Jenkins presents with complaints of asthma flair. Worse last night. Has been using nebulizer and inhaler. Chest felt tight. Shortness of breath . Repeated breathing treatment and sat up, and symptoms were better managed. Somewhat better this morning. Still with wheezing. Overall started yesterday. Wheezing started yesterday. Used inhaler this am. No fevers no headache no body aches. Mild sinus pressure to nose. No drainage. Throat feels raw from cough. Some mucus production with cough, although also dry cough. No known ill contacts. Works at [employer]. Reflux medication. Doesn't use any allergy medication. History  Of asthma, gerd, htn.     ROS per HPI, negative if not otherwise mentioned.      Past Medical History:  Diagnosis Date  . Asthma   . Ectopic pregnancy   . GERD (gastroesophageal reflux disease)   . Hypertension     Patient Active Problem List   Diagnosis Date Noted  . Spider veins of both lower extremities 10/18/2015  . Tobacco dependence 02/15/2015  . Prehypertension 02/15/2015  . INSOMNIA 07/07/2010  . VARICOSE VEINS, LOWER EXTREMITIES 02/17/2010  . Asthma 02/17/2010  . PAP SMEAR, ABNORMAL, ASCUS 02/07/2009  . FIBROIDS, UTERUS 11/11/1998    Past Surgical History:  Procedure Laterality Date  . ECTOPIC PREGNANCY SURGERY     x 2   . SALPINGECTOMY      OB History   No obstetric history on file.      Home Medications    Prior to Admission medications   Medication Sig Start Date End Date Taking? Authorizing Provider  albuterol (2.5 MG/3ML) 0.083% NEBU 3 mL, albuterol (5 MG/ML) 0.5% NEBU 0.5 mL Inhale into the lungs.   Yes [provider]  albuterol (PROVENTIL HFA;VENTOLIN HFA) 108 (90 BASE) MCG/ACT inhaler Inhale  2 puffs into the lungs every 6 (six) hours as needed for wheezing.   Yes [provider]  amLODipine (NORVASC) 5 MG tablet  03/07/17  Yes [provider]  Fluticasone Furoate-Vilanterol (BREO ELLIPTA IN) Inhale into the lungs.   Yes [provider]  hydrochlorothiazide (HYDRODIURIL) 25 MG tablet Take 25 mg by mouth daily.   Yes [provider]  pantoprazole (PROTONIX) 20 MG tablet  03/07/17  Yes [provider]  benzonatate (TESSALON) 100 MG capsule Take 2 capsules (200 mg total) by mouth 3 (three) times daily as needed for cough. 07/28/18   Zigmund Gottron, NP  buPROPion (ZYBAN) 150 MG 12 hr tablet  03/07/17   [provider]  HYDROcodone-homatropine (HYCODAN) 5-1.5 MG/5ML syrup Take 5 mLs by mouth at bedtime as needed for cough. 07/28/18   Zigmund Gottron, NP  meloxicam (MOBIC) 15 MG tablet  03/07/17   [provider]  predniSONE (DELTASONE) 20 MG tablet Take 2 tablets (40 mg total) by mouth daily with breakfast for 5 days. 02/14/19 02/19/19  Zigmund Gottron, NP    Family History Family History  Problem Relation Age of Onset  . Hyperlipidemia Mother   . Depression Mother   . Depression Maternal Aunt     Social History Social History   Tobacco Use  . Smoking status: Former Smoker    Packs/day: 1.00    Types: Cigarettes  . Smokeless tobacco:  Never Used  Substance Use Topics  . Alcohol use: Yes    Alcohol/week: 2.0 standard drinks    Types: 2 Glasses of wine per week  . Drug use: No     Allergies   Patient has no known allergies.   Review of Systems Review of Systems   Physical Exam Triage Vital Signs ED Triage Vitals  Enc Vitals Group     BP 02/14/19 1156 (!) 139/100     Pulse Rate 02/14/19 1156 79     Resp 02/14/19 1156 16     Temp 02/14/19 1156 98.1 F (36.7 C)     Temp Source 02/14/19 1156 Other     SpO2 02/14/19 1156 96 %     Weight --      Height --      Head Circumference --      Peak Flow --       Pain Score 02/14/19 1158 0     Pain Loc --      Pain Edu? --      Excl. in [place]? --    No data found.  Updated Vital Signs BP (!) 139/100 Comment: has not taken HTN med today  Pulse 79   Temp 98.1 F (36.7 C) (Other (Comment))   Resp 16   SpO2 96%    Physical Exam Constitutional:      General: She is not in acute distress.    Appearance: She is well-developed.  Cardiovascular:     Rate and Rhythm: Normal rate and regular rhythm.     Heart sounds: Normal heart sounds.  Pulmonary:     Effort: Pulmonary effort is normal. No respiratory distress.     Breath sounds: Wheezing present.  Skin:    General: Skin is warm and dry.  Neurological:     Mental Status: She is alert and oriented to person, place, and time.      UC Treatments / Results  Labs (all labs ordered are listed, but only abnormal results are displayed) Labs Reviewed  NOVEL CORONAVIRUS, NAA (HOSP ORDER, SEND-OUT TO REF LAB; TAT 18-24 HRS)    EKG   Radiology No results found.  Procedures Procedures (including critical care time)  Medications Ordered in UC Medications  methylPREDNISolone sodium succinate (SOLU-MEDROL) 125 mg/2 mL injection 125 mg (125 mg Intramuscular Given 02/14/19 1222)  methylPREDNISolone sodium succinate (SOLU-MEDROL) 125 mg/2 mL injection (has no administration in time range)    Initial Impression / Assessment and Plan / UC Course  I have reviewed the triage vital signs and the nursing notes.  Pertinent labs & imaging results that were available during my care of the patient were reviewed by me and considered in my medical decision making (see chart for details).    Afebrile. No hypoxia. Patient endorses feels similar to previous asthma flares. Consistent with exam. Solumedrol provided. Course of prednisone. 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 Will notify of any positive findings and if any changes to treatment are needed.  Return precautions  provided. Patient verbalized understanding and agreeable to plan.    Final Clinical Impressions(s) / UC Diagnoses   Final diagnoses:  Mild intermittent asthma with exacerbation     Discharge Instructions     May add in allergy medications as well as allergies can trigger asthma.  Use of inhaler as needed for wheezing or shortness of breath.   Course of prednisone.  Self isolate until covid results are back and negative.  Will notify  you of any positive findings. You may monitor your results on your MyChart online as well.       ED Prescriptions    Medication Sig Dispense Auth. Provider   predniSONE (DELTASONE) 20 MG tablet Take 2 tablets (40 mg total) by mouth daily with breakfast for 5 days. 10 tablet Zigmund Gottron, NP     Controlled Substance Prescriptions [place] Controlled Substance Registry consulted? Not Applicable   Zigmund Gottron, NP 02/14/19 1236

## 2019-02-14 NOTE — Discharge Instructions (Signed)
May add in allergy medications as well as allergies can trigger asthma.  Use of inhaler as needed for wheezing or shortness of breath.   Course of prednisone.  Self isolate until covid results are back and negative.  Will notify you of any positive findings. You may monitor your results on your MyChart online as well.

## 2019-02-15 LAB — NOVEL CORONAVIRUS, NAA (HOSP ORDER, SEND-OUT TO REF LAB; TAT 18-24 HRS): SARS-CoV-2, NAA: NOT DETECTED

## 2019-02-16 ENCOUNTER — Encounter (HOSPITAL_COMMUNITY): Payer: Self-pay

## 2019-02-17 MED FILL — BREO ELLIPTA 100-25 MCG INH: 100-25 | 30 days supply | Qty: 60 | Fill #1

## 2019-03-12 MED FILL — AMLODIPINE BESYLATE 5 MG TA: 5 | 90 days supply | Qty: 90 | Fill #2

## 2019-03-12 MED FILL — HYDROCHLOROTHIAZIDE 25 MG T: 25 | 90 days supply | Qty: 90 | Fill #2

## 2019-03-12 MED FILL — PANTOPRAZOLE SOD DR 20 MG T: 20 | 90 days supply | Qty: 90 | Fill #1

## 2019-03-16 MED FILL — BREO ELLIPTA 100-25 MCG INH: 100-25 | 30 days supply | Qty: 60 | Fill #2

## 2019-03-24 DIAGNOSIS — M5442 Lumbago with sciatica, left side: Secondary | ICD-10-CM | POA: Diagnosis not present

## 2019-03-24 MED FILL — predniSONE 10 MG (21) TBPK: 10 | 6 days supply | Qty: 21 | Fill #0

## 2019-03-24 MED FILL — traMADol HCL 50 MG TABS: 50 | 10 days supply | Qty: 20 | Fill #0

## 2019-03-24 MED FILL — METHOCARBAMOL 750 MG TABS: 750 | 10 days supply | Qty: 30 | Fill #0

## 2019-04-17 DIAGNOSIS — M5442 Lumbago with sciatica, left side: Secondary | ICD-10-CM | POA: Diagnosis not present

## 2019-04-20 MED FILL — predniSONE 10 MG (21) TBPK: 10 | 6 days supply | Qty: 21 | Fill #0

## 2019-04-24 ENCOUNTER — Other Ambulatory Visit: Payer: Self-pay | Admitting: Internal Medicine

## 2019-04-24 ENCOUNTER — Ambulatory Visit
Admission: RE | Admit: 2019-04-24 | Discharge: 2019-04-24 | Disposition: A | Discharge status: Home / Self Care | Payer: 59 | Source: Ambulatory Visit | Attending: Internal Medicine | Admitting: Internal Medicine

## 2019-04-24 DIAGNOSIS — M5442 Lumbago with sciatica, left side: Secondary | ICD-10-CM

## 2019-04-24 DIAGNOSIS — M47816 Spondylosis without myelopathy or radiculopathy, lumbar region: Secondary | ICD-10-CM | POA: Diagnosis not present

## 2019-04-25 MED FILL — BREO ELLIPTA 100-25 MCG INH: 100-25 | 30 days supply | Qty: 60 | Fill #3

## 2019-05-05 DIAGNOSIS — Z0000 Encounter for general adult medical examination without abnormal findings: Secondary | ICD-10-CM | POA: Diagnosis not present

## 2019-05-05 DIAGNOSIS — I10 Essential (primary) hypertension: Secondary | ICD-10-CM | POA: Diagnosis not present

## 2019-05-05 DIAGNOSIS — M5442 Lumbago with sciatica, left side: Secondary | ICD-10-CM | POA: Diagnosis not present

## 2019-05-05 DIAGNOSIS — E7800 Pure hypercholesterolemia, unspecified: Secondary | ICD-10-CM | POA: Diagnosis not present

## 2019-05-05 DIAGNOSIS — J45909 Unspecified asthma, uncomplicated: Secondary | ICD-10-CM | POA: Diagnosis not present

## 2019-05-11 DIAGNOSIS — M5431 Sciatica, right side: Secondary | ICD-10-CM | POA: Diagnosis not present

## 2019-05-11 MED FILL — METHYLPREDNISOLONE 4 MG TBP: 4 | 6 days supply | Qty: 21 | Fill #0

## 2019-05-20 DIAGNOSIS — R55 Syncope and collapse: Secondary | ICD-10-CM | POA: Diagnosis not present

## 2019-05-20 DIAGNOSIS — R531 Weakness: Secondary | ICD-10-CM | POA: Diagnosis not present

## 2019-05-21 ENCOUNTER — Ambulatory Visit: Payer: 59 | Admitting: Physical Therapy

## 2019-05-22 ENCOUNTER — Encounter (HOSPITAL_COMMUNITY): Payer: Self-pay | Admitting: Emergency Medicine

## 2019-05-22 ENCOUNTER — Ambulatory Visit (HOSPITAL_COMMUNITY)
Admission: EM | Admit: 2019-05-22 | Discharge: 2019-05-22 | Disposition: A | Discharge status: Home / Self Care | Payer: 59 | Attending: Family Medicine | Admitting: Family Medicine

## 2019-05-22 ENCOUNTER — Other Ambulatory Visit: Payer: Self-pay

## 2019-05-22 DIAGNOSIS — W19XXXA Unspecified fall, initial encounter: Secondary | ICD-10-CM | POA: Insufficient documentation

## 2019-05-22 DIAGNOSIS — R5383 Other fatigue: Secondary | ICD-10-CM

## 2019-05-22 DIAGNOSIS — Z7951 Long term (current) use of inhaled steroids: Secondary | ICD-10-CM | POA: Diagnosis not present

## 2019-05-22 DIAGNOSIS — R519 Headache, unspecified: Secondary | ICD-10-CM

## 2019-05-22 DIAGNOSIS — R0981 Nasal congestion: Secondary | ICD-10-CM

## 2019-05-22 DIAGNOSIS — U071 COVID-19: Secondary | ICD-10-CM | POA: Insufficient documentation

## 2019-05-22 DIAGNOSIS — R509 Fever, unspecified: Secondary | ICD-10-CM

## 2019-05-22 DIAGNOSIS — Z79899 Other long term (current) drug therapy: Secondary | ICD-10-CM | POA: Diagnosis not present

## 2019-05-22 DIAGNOSIS — Z87891 Personal history of nicotine dependence: Secondary | ICD-10-CM | POA: Diagnosis not present

## 2019-05-22 DIAGNOSIS — I10 Essential (primary) hypertension: Secondary | ICD-10-CM | POA: Diagnosis not present

## 2019-05-22 DIAGNOSIS — G4700 Insomnia, unspecified: Secondary | ICD-10-CM | POA: Diagnosis not present

## 2019-05-22 DIAGNOSIS — J4521 Mild intermittent asthma with (acute) exacerbation: Secondary | ICD-10-CM | POA: Diagnosis not present

## 2019-05-22 DIAGNOSIS — Z791 Long term (current) use of non-steroidal anti-inflammatories (NSAID): Secondary | ICD-10-CM | POA: Diagnosis not present

## 2019-05-22 DIAGNOSIS — K219 Gastro-esophageal reflux disease without esophagitis: Secondary | ICD-10-CM | POA: Insufficient documentation

## 2019-05-22 DIAGNOSIS — Z20828 Contact with and (suspected) exposure to other viral communicable diseases: Secondary | ICD-10-CM | POA: Diagnosis not present

## 2019-05-22 DIAGNOSIS — Z20822 Contact with and (suspected) exposure to covid-19: Secondary | ICD-10-CM

## 2019-05-22 DIAGNOSIS — R05 Cough: Secondary | ICD-10-CM | POA: Diagnosis not present

## 2019-05-22 DIAGNOSIS — R55 Syncope and collapse: Secondary | ICD-10-CM | POA: Diagnosis not present

## 2019-05-22 MED ORDER — PREDNISONE 20 MG PO TABS: 20.0000 mg | ORAL_TABLET | Freq: Two times a day (BID) | ORAL | 0 refills | Status: AC

## 2019-05-22 MED ORDER — BENZONATATE 200 MG PO CAPS: 200.0000 mg | ORAL_CAPSULE | Freq: Two times a day (BID) | ORAL | 0 refills | Status: AC | PRN

## 2019-05-22 MED FILL — BENZONATATE 200 MG CAP: 200 | 10 days supply | Qty: 20 | Fill #0

## 2019-05-22 MED FILL — predniSONE 20 MG TABS: 20 | 5 days supply | Qty: 10 | Fill #0

## 2019-05-22 NOTE — ED Provider Notes (Signed)
[facility]    CSN: [id number] Arrival date & time: 05/22/19  1244      History   Chief Complaint Chief Complaint  Patient presents with  . Asthma  . Facial Pain  . Fall    HPI Brianna Jenkins is a 58 y.o. female.   HPI  Patient is here with multiple family members all to be tested for coronavirus. She has burning in her nose and sinus passages, congestion in her face, headache, cough and shortness of breath for the last several days.  She states that she was at home 2 days ago and had a spell where she felt very hot.  She stood up to turn down the thermostat and fainted.  Family members were present.  She was not unconscious for very long, just a few moments.  They called the paramedics who evaluated her, did her vital signs, checked her blood sugar.  Everything was normal.  They recommended emergency room visit although she declined.  She has been under tremendous stress with the recent death of her nephew.  No known exposure to coronavirus, she states that she has tried to be careful regarding social distancing, handwashing, and wearing a mask.  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  Past Medical History:  Diagnosis Date  . Asthma   . Ectopic pregnancy   . GERD (gastroesophageal reflux disease)   . Hypertension     Patient Active Problem List   Diagnosis Date Noted  . Spider veins of both lower extremities 10/18/2015  . Tobacco dependence 02/15/2015  . Prehypertension 02/15/2015  . INSOMNIA 07/07/2010  . VARICOSE VEINS, LOWER EXTREMITIES 02/17/2010  . Asthma 02/17/2010  . PAP SMEAR, ABNORMAL, ASCUS 02/07/2009  . FIBROIDS, UTERUS 11/11/1998    Past Surgical History:  Procedure Laterality Date  . ECTOPIC PREGNANCY SURGERY     x 2   . SALPINGECTOMY      OB History   No obstetric history on file.      Home Medications    Prior to Admission medications   Medication Sig Start Date End Date Taking? Authorizing Provider  albuterol (2.5  MG/3ML) 0.083% NEBU 3 mL, albuterol (5 MG/ML) 0.5% NEBU 0.5 mL Inhale into the lungs.   Yes [provider]  albuterol (PROVENTIL HFA;VENTOLIN HFA) 108 (90 BASE) MCG/ACT inhaler Inhale 2 puffs into the lungs every 6 (six) hours as needed for wheezing.   Yes [provider]  amLODipine (NORVASC) 5 MG tablet  03/07/17  Yes [provider]  hydrochlorothiazide (HYDRODIURIL) 25 MG tablet Take 25 mg by mouth daily.   Yes [provider]  benzonatate (TESSALON) 200 MG capsule Take 1 capsule (200 mg total) by mouth 2 (two) times daily as needed for cough. 05/22/19   Raylene Everts, MD  buPROPion (ZYBAN) 150 MG 12 hr tablet  03/07/17   [provider]  Fluticasone Furoate-Vilanterol (BREO ELLIPTA IN) Inhale into the lungs.    [provider]  meloxicam (MOBIC) 15 MG tablet  03/07/17   [provider]  pantoprazole (PROTONIX) 20 MG tablet  03/07/17   [provider]  predniSONE (DELTASONE) 20 MG tablet Take 1 tablet (20 mg total) by mouth 2 (two) times daily with a meal. 05/22/19   Raylene Everts, MD    Family History Family History  Problem Relation Age of Onset  . Hyperlipidemia Mother   . Depression Mother   . Depression Maternal Aunt     Social History Social  History   Tobacco Use  . Smoking status: Former Smoker    Packs/day: 1.00    Types: Cigarettes  . Smokeless tobacco: Never Used  Substance Use Topics  . Alcohol use: Yes    Alcohol/week: 2.0 standard drinks    Types: 2 Glasses of wine per week  . Drug use: No     Allergies   Patient has no known allergies.   Review of Systems Review of Systems  Constitutional: Positive for chills, fatigue and fever.  HENT: Positive for sinus pressure. Negative for congestion and hearing loss.   Eyes: Negative for pain.  Respiratory: Positive for cough, shortness of breath and wheezing.   Cardiovascular: Negative for chest pain and leg swelling.    Gastrointestinal: Negative for abdominal pain, constipation and diarrhea.  Genitourinary: Negative for dysuria and frequency.  Musculoskeletal: Negative for myalgias.  Neurological: Positive for headaches. Negative for dizziness and seizures.  Psychiatric/Behavioral: The patient is not nervous/anxious.      Physical Exam Triage Vital Signs ED Triage Vitals  Enc Vitals Group     BP 05/22/19 1404 (!) 124/91     Pulse Rate 05/22/19 1404 91     Resp 05/22/19 1404 16     Temp 05/22/19 1404 99.2 F (37.3 C)     Temp Source 05/22/19 1404 Oral     SpO2 05/22/19 1404 99 %     Weight --      Height --      Head Circumference --      Peak Flow --      Pain Score 05/22/19 1401 6     Pain Loc --      Pain Edu? --      Excl. in [place]? --    No data found.  Updated Vital Signs BP (!) 124/91 (BP Location: Left Arm)   Pulse 91   Temp 99.2 F (37.3 C) (Oral)   Resp 16   SpO2 99%      Physical Exam Constitutional:      General: She is not in acute distress.    Appearance: She is well-developed and normal weight.     Comments: Appears tired  HENT:     Head: Normocephalic and atraumatic.     Nose: Congestion present.     Mouth/Throat:     Mouth: Mucous membranes are moist.     Pharynx: No posterior oropharyngeal erythema.  Eyes:     Conjunctiva/sclera: Conjunctivae normal.     Pupils: Pupils are equal, round, and reactive to light.  Cardiovascular:     Rate and Rhythm: Normal rate and regular rhythm.     Heart sounds: Normal heart sounds.  Pulmonary:     Effort: Pulmonary effort is normal. No respiratory distress.     Breath sounds: Wheezing present. No rhonchi or rales.     Comments: Scattered wheeze Abdominal:     General: There is no distension.     Palpations: Abdomen is soft.  Musculoskeletal:        General: Normal range of motion.     Cervical back: Normal range of motion and neck supple.     Comments: Back brace  Skin:    General: Skin is warm and dry.   Neurological:     General: No focal deficit present.     Mental Status: She is alert.  Psychiatric:        Mood and Affect: Mood normal.        Behavior: Behavior normal.  UC Treatments / Results  Labs (all labs ordered are listed, but only abnormal results are displayed) Labs Reviewed  NOVEL CORONAVIRUS, NAA (HOSP ORDER, SEND-OUT TO REF LAB; TAT 18-24 HRS)    EKG   Radiology No results found.  Procedures Procedures (including critical care time)  Medications Ordered in UC Medications - No data to display  Initial Impression / Assessment and Plan / UC Course  I have reviewed the triage vital signs and the nursing notes.  Pertinent labs & imaging results that were available during my care of the patient were reviewed by me and considered in my medical decision making (see chart for details).     Likely coronavirus.  Her sig other, here in another room tested positive.  Recommend quarantine.  Symptomatic care recommended. Final Clinical Impressions(s) / UC Diagnoses   Final diagnoses:  Fall, initial encounter  Mild intermittent asthma with exacerbation  Suspected COVID-19 virus infection  Close exposure to COVID-19 virus     Discharge Instructions     Quarantine until your test results are back Push fluids Tylenol for pain or fever Tessalon as needed cough Prednisone 2 x a day for 5 days Go to ER if worsening shortness of breath, weakness, dizziness or fainting    ED Prescriptions    Medication Sig Dispense Auth. Provider   predniSONE (DELTASONE) 20 MG tablet Take 1 tablet (20 mg total) by mouth 2 (two) times daily with a meal. 10 tablet Raylene Everts, MD   benzonatate (TESSALON) 200 MG capsule Take 1 capsule (200 mg total) by mouth 2 (two) times daily as needed for cough. 20 capsule Raylene Everts, MD     PDMP not reviewed this encounter.   Raylene Everts, MD 05/22/19 [id number]

## 2019-05-22 NOTE — Discharge Instructions (Signed)
Quarantine until your test results are back Push fluids Tylenol for pain or fever Tessalon as needed cough Prednisone 2 x a day for 5 days Go to ER if worsening shortness of breath, weakness, dizziness or fainting

## 2019-05-22 NOTE — ED Triage Notes (Signed)
Pt reports burning in her nose with some sinus pain and pressure since Tuesday.  She also reports her asthma has been acting up since Tuesday and she has had a cough due to the asthma.  Pt states she blacked out on Wednesday, was seen by EMS.  She has pain to her left side from falling.

## 2019-05-24 LAB — NOVEL CORONAVIRUS, NAA (HOSP ORDER, SEND-OUT TO REF LAB; TAT 18-24 HRS): SARS-CoV-2, NAA: DETECTED — AB

## 2019-05-26 ENCOUNTER — Telehealth (HOSPITAL_COMMUNITY): Payer: Self-pay | Admitting: Emergency Medicine

## 2019-05-26 ENCOUNTER — Encounter (HOSPITAL_COMMUNITY): Payer: Self-pay

## 2019-05-26 MED FILL — METHYLPREDNISOLONE 4 MG TBP: 4 | 6 days supply | Qty: 21 | Fill #0

## 2019-05-26 MED FILL — BREO ELLIPTA 100-25 MCG INH: 100-25 | 30 days supply | Qty: 60 | Fill #4

## 2019-05-26 NOTE — Telephone Encounter (Signed)
Patient contacted by phone and made aware of   positive covid results. Pt verbalized understanding and had all questions answered.

## 2019-06-04 DIAGNOSIS — J40 Bronchitis, not specified as acute or chronic: Secondary | ICD-10-CM | POA: Diagnosis not present

## 2019-06-04 DIAGNOSIS — M79672 Pain in left foot: Secondary | ICD-10-CM | POA: Diagnosis not present

## 2019-06-04 MED FILL — HYDROCODONE-CHLORPHEN ER SU: 10-8 | 10 days supply | Qty: 100 | Fill #0

## 2019-06-04 MED FILL — predniSONE 10 MG (21) TBPK: 10 | 6 days supply | Qty: 21 | Fill #0

## 2019-06-08 ENCOUNTER — Other Ambulatory Visit: Payer: Self-pay | Admitting: Internal Medicine

## 2019-06-08 ENCOUNTER — Ambulatory Visit
Admission: RE | Admit: 2019-06-08 | Discharge: 2019-06-08 | Disposition: A | Discharge status: Home / Self Care | Payer: 59 | Source: Ambulatory Visit | Attending: Internal Medicine | Admitting: Internal Medicine

## 2019-06-08 DIAGNOSIS — M79672 Pain in left foot: Secondary | ICD-10-CM

## 2019-06-08 DIAGNOSIS — S92515A Nondisplaced fracture of proximal phalanx of left lesser toe(s), initial encounter for closed fracture: Secondary | ICD-10-CM | POA: Diagnosis not present

## 2019-06-15 DIAGNOSIS — M5431 Sciatica, right side: Secondary | ICD-10-CM | POA: Diagnosis not present

## 2019-06-15 MED FILL — MELOXICAM 15 MG TABLET: 15 | 30 days supply | Qty: 30 | Fill #0

## 2019-06-22 ENCOUNTER — Other Ambulatory Visit: Payer: Self-pay

## 2019-06-22 ENCOUNTER — Ambulatory Visit: Payer: 59 | Attending: Orthopedic Surgery

## 2019-06-22 DIAGNOSIS — M25651 Stiffness of right hip, not elsewhere classified: Secondary | ICD-10-CM | POA: Diagnosis not present

## 2019-06-22 DIAGNOSIS — M62830 Muscle spasm of back: Secondary | ICD-10-CM | POA: Insufficient documentation

## 2019-06-22 DIAGNOSIS — M2560 Stiffness of unspecified joint, not elsewhere classified: Secondary | ICD-10-CM | POA: Diagnosis not present

## 2019-06-22 DIAGNOSIS — R293 Abnormal posture: Secondary | ICD-10-CM | POA: Diagnosis not present

## 2019-06-22 DIAGNOSIS — M25652 Stiffness of left hip, not elsewhere classified: Secondary | ICD-10-CM | POA: Insufficient documentation

## 2019-06-22 DIAGNOSIS — M5432 Sciatica, left side: Secondary | ICD-10-CM | POA: Insufficient documentation

## 2019-06-22 NOTE — Therapy (Signed)
[place] [place], [place], [address] Phone: [phone]   Fax:  [phone]  Physical Therapy Evaluation  Patient Details  Name: Brianna Jenkins MRN: [id number] Date of Birth: Sep 07, 1960 Referring Provider (PT): Marchia Bond, MD   Encounter Date: 06/22/2019  PT End of Session - 06/22/19 1219    Visit Number  1    Number of Visits  16    Date for PT Re-Evaluation  08/14/19    Authorization Type  [facility]    PT Start Time  1045    PT Stop Time  1130    PT Time Calculation (min)  45 min    Activity Tolerance  Patient limited by pain    Behavior During Therapy  [facility] for tasks assessed/performed       Past Medical History:  Diagnosis Date  . Asthma   . Ectopic pregnancy   . GERD (gastroesophageal reflux disease)   . Hypertension     Past Surgical History:  Procedure Laterality Date  . ECTOPIC PREGNANCY SURGERY     x 2   . SALPINGECTOMY      There were no vitals filed for this visit.   Subjective Assessment - 06/22/19 1059    Subjective  She reports lower back pain.   Has had injections with no benefit.    No injury.   No previous episodes of pain.    Limitations  Walking;House hold activities;Standing;Lifting   work.   How long can you sit comfortably?  As needed    How long can you stand comfortably?  has not timed. but maybe 60-90 min    How long can you walk comfortably?  when back hurt she is limited    Diagnostic tests  xray, Degenerative changes.    Patient Stated Goals  She wants to resolve back pain.    Currently in Pain?  Yes    Pain Score  3     Pain Location  Back    Pain Orientation  Right;Left;Lower   more on LT   Pain Descriptors / Indicators  Aching    Pain Type  Chronic pain    Pain Radiating Towards  posterior thighs    Pain Onset  More than a month ago    Pain Frequency  Constant    Aggravating Factors   bending, standing , work    Pain Relieving Factors  sit, medication ,  creams         [facility] PT Assessment - 06/22/19 0001      Assessment   Medical Diagnosis  sciatica    Referring Provider (PT)  Marchia Bond, MD    Onset Date/Surgical Date  --   Months ago   Next MD Visit  2/21    Prior Therapy  Npo      Precautions   Precautions  None      Restrictions   Weight Bearing Restrictions  No      Balance Screen   Has the patient fallen in the past 6 months  No      Prior Function   Level of Independence  Independent    Vocation  Full time employment    Journalist, newspaper      Cognition   Overall Cognitive Status  Within Functional Limits for tasks assessed      Observation/Other Assessments   Focus on Therapeutic Outcomes (FOTO)   51% limited      Posture/Postural Control  Posture Comments  LTshoulder elevated , increased lordosis      ROM / Strength   AROM / PROM / Strength  AROM;Strength;PROM      AROM   AROM Assessment Site  Lumbar    Lumbar Flexion  60     Lumbar Extension  10    Lumbar - Right Side Bend  15    Lumbar - Left Side Bend  15   more pain going to Lt.     PROM   Overall PROM Comments  90 degrees bilateral hip flexion, bilateral hip stiffness into extesnion  and rotation bilaterally      Strength   Overall Strength Comments  Grossly WNL but with pain on hip testing.  fair abdominals      Flexibility   Soft Tissue Assessment /Muscle Length  yes    Hamstrings  60 degrees bilateral and more pain on LT     Quadriceps  tight bilaterally      Palpation   Spinal mobility  stiffness of lower spine with PA glide    SI assessment   stiff side glide to Lt,     Palpation comment  tender in lowetr back and anterior/lateral and posterior hips.       Ambulation/Gait   Gait Comments  normal                Objective measurements completed on examination: See above findings.              PT Education - 06/22/19 1106    Education Details  POC, HEP    Person(s) Educated   Patient    Methods  Explanation;Tactile cues;Verbal cues;Handout    Comprehension  Verbalized understanding;Returned demonstration       PT Short Term Goals - 06/22/19 1222      PT SHORT TERM GOAL #1   Title  She will be indpendent with intial HEP    Time  3    Period  Weeks    Status  New      PT SHORT TERM GOAL #2   Title  She will report pain decreased 20-30% generally    Time  3    Period  Weeks    Status  New      PT SHORT TERM GOAL #3   Title  She will report able to work with 10-25% les [name] for a whole shift.    Time  4    Period  Weeks    Status  New      PT SHORT TERM GOAL #4   Title  FOTO scor ewill decr to 40%limited    Time  4    Period  Weeks    Status  New        PT Long Term Goals - 06/22/19 1226      PT LONG TERM GOAL #1   Title  She will be indpendent with all hEP issued    Time  8    Period  Weeks    Status  New      PT LONG TERM GOAL #2   Title  She will report her pain as intermittant and mild  as highest pain level    Time  8    Period  Weeks    Status  New      PT LONG TERM GOAL #3   Title  She will report able to stand and work full day with mild to no pain  Time  8    Period  Weeks    Status  New      PT LONG TERM GOAL #4   Title  She will be able to do all home tasks with mild to no pain.    Time  8    Period  Weeks    Status  New      PT LONG TERM GOAL #5   Title  FOTO score will decrease to 35% limited    Time  8    Period  Weeks    Status  New             Plan - 06/22/19 1220    Clinical Impression Statement  [name] presents with bilateral lower back pain and hip pain with pain in posterior thighs to knees   She appears to have more pain with extended positions  and activity and decreased pain with flexed postures (sitting).  She has general stiffness of all ROIM in both hips and this may contribute to her back pain.  She has days she feel only minor pain and others severe. 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    She should improve with skilled PT  and a consistent HEP but will be slow due to duration of symptoms and  work that aggravates her symptoms.    Personal Factors and Comorbidities  Time since onset of injury/illness/exacerbation    Examination-Activity Limitations  Bend;Reach Overhead;Locomotion Level;Sleep;Stand;Lift    Examination-Participation Restrictions  Meal Prep;Cleaning;Community Activity;Shop;Laundry    Stability/Clinical Decision Making  Evolving/Moderate complexity    Clinical Decision Making  Moderate    Rehab Potential  Good    PT Frequency  2x / week    PT Duration  8 weeks    PT Treatment/Interventions  Electrical Stimulation;Moist Heat;Therapeutic exercise;Therapeutic activities;Patient/family education;Manual techniques;Dry needling;Passive range of motion;Taping    PT Next Visit Plan  Manual and modalities,   Review and expand HEP    PT Home Exercise Plan  LTR , PPT    Consulted and Agree with Plan of Care  Patient       Patient will benefit from skilled therapeutic intervention in order to improve the following deficits and impairments:  Decreased range of motion, Difficulty walking, Pain, Increased muscle spasms, Decreased activity tolerance, Postural dysfunction, Decreased strength  Visit Diagnosis: Sciatica, left side  Joint stiffness of spine  Muscle spasm of back  Stiffness of right hip, not elsewhere classified  Stiffness of left hip, not elsewhere classified  Abnormal posture     Problem List Patient Active Problem List   Diagnosis Date Noted  . Spider veins of both lower extremities 10/18/2015  . Tobacco dependence 02/15/2015  . Prehypertension 02/15/2015  . INSOMNIA 07/07/2010  . VARICOSE VEINS, LOWER EXTREMITIES 02/17/2010  . Asthma 02/17/2010  . PAP SMEAR, ABNORMAL, ASCUS 02/07/2009  . FIBROIDS, UTERUS 11/11/1998    Darrel Hoover  PT 06/22/2019, 1:08 PM  [facility] [address] [place], [place], [address] Phone: [phone]   Fax:  [phone]  Name: Dollinda Dicello MRN: [id number] Date of Birth: 04-02-1961

## 2019-06-22 NOTE — Patient Instructions (Signed)
LTR and PPT x 5-10 reps 5 -10 sec hold 2x/day daily

## 2019-06-30 ENCOUNTER — Ambulatory Visit: Payer: 59

## 2019-06-30 ENCOUNTER — Other Ambulatory Visit: Payer: Self-pay

## 2019-06-30 DIAGNOSIS — M25652 Stiffness of left hip, not elsewhere classified: Secondary | ICD-10-CM | POA: Diagnosis not present

## 2019-06-30 DIAGNOSIS — M2560 Stiffness of unspecified joint, not elsewhere classified: Secondary | ICD-10-CM | POA: Diagnosis not present

## 2019-06-30 DIAGNOSIS — R293 Abnormal posture: Secondary | ICD-10-CM | POA: Diagnosis not present

## 2019-06-30 DIAGNOSIS — M5432 Sciatica, left side: Secondary | ICD-10-CM

## 2019-06-30 DIAGNOSIS — M25651 Stiffness of right hip, not elsewhere classified: Secondary | ICD-10-CM | POA: Diagnosis not present

## 2019-06-30 DIAGNOSIS — M62830 Muscle spasm of back: Secondary | ICD-10-CM | POA: Diagnosis not present

## 2019-06-30 NOTE — Patient Instructions (Signed)
Hip abduction with legs extended and with knees bent 30 sec x 23- reps2x/day,,,, standing short range flexion with towel roll across lower sacrum x 10 1x/day

## 2019-06-30 NOTE — Therapy (Signed)
[place], [place], [address] Phone: [phone]   Fax:  [phone]  Physical Therapy Treatment  Patient Details  Name: Brianna Jenkins MRN: [id number] Date of Birth: Sep 16, 1960 Referring Provider (PT): Marchia Bond, MD   Encounter Date: 06/30/2019  PT End of Session - 06/30/19 1047    Visit Number  2    Number of Visits  16    Date for PT Re-Evaluation  08/14/19    Authorization Type  UMR    PT Start Time  1045    PT Stop Time  1140    PT Time Calculation (min)  55 min    Activity Tolerance  Patient limited by pain    Behavior During Therapy  [facility] for tasks assessed/performed       Past Medical History:  Diagnosis Date  . Asthma   . Ectopic pregnancy   . GERD (gastroesophageal reflux disease)   . Hypertension     Past Surgical History:  Procedure Laterality Date  . ECTOPIC PREGNANCY SURGERY     x 2   . SALPINGECTOMY      There were no vitals filed for this visit.  Subjective Assessment - 06/30/19 1051    Subjective  Back and neck hurt today.    Pain in back about same.   She reports OK with HEP and eased some of the kinks out    Pain Score  3     Pain Location  Back    Pain Orientation  Right;Left;Lower    Pain Descriptors / Indicators  Aching    Pain Type  Chronic pain    Pain Radiating Towards  post thighs    Pain Onset  More than a month ago    Pain Frequency  Constant    Aggravating Factors   bending and standing for work    Pain Relieving Factors  sit , meds                       [facility] Adult PT Treatment/Exercise - 06/30/19 0001      Exercises   Exercises  Lumbar      Lumbar Exercises: Stretches   Lower Trunk Rotation  5 reps;10 seconds    Pelvic Tilt  15 reps;5 seconds    Other Lumbar Stretch Exercise  hip flexion , abduction with legs straight and bent 205 min RT and LT  each      Lumbar Exercises: Aerobic   Nustep  L2 5 min UE and LE      Lumbar  Exercises: Standing   Other Standing Lumbar Exercises  flexion with roll at lower scrun x 10 20 degrees trunk flexion cued for chin tuck due to onset of neck pain earlier this AM      Modalities   Modalities  Moist Heat      Moist Heat Therapy   Number Minutes Moist Heat  10 Minutes    Moist Heat Location  Cervical      Manual Therapy   Manual Therapy  Passive ROM;Manual Traction    Passive ROM  flexion and rotation strenching RT and LT hip    Manual Traction  each leg with osscilations x 5 min.              PT Education - 06/30/19 1245    Education Details  HEP    Person(s) Educated  Patient    Methods  Explanation;Demonstration;Tactile cues;Verbal cues;Handout  Comprehension  Returned demonstration;Verbalized understanding       PT Short Term Goals - 06/22/19 1222      PT SHORT TERM GOAL #1   Title  She will be indpendent with intial HEP    Time  3    Period  Weeks    Status  New      PT SHORT TERM GOAL #2   Title  She will report pain decreased 20-30% generally    Time  3    Period  Weeks    Status  New      PT SHORT TERM GOAL #3   Title  She will report able to work with 10-25% les [name] for a whole shift.    Time  4    Period  Weeks    Status  New      PT SHORT TERM GOAL #4   Title  FOTO scor ewill decr to 40%limited    Time  4    Period  Weeks    Status  New        PT Long Term Goals - 06/22/19 1226      PT LONG TERM GOAL #1   Title  She will be indpendent with all hEP issued    Time  8    Period  Weeks    Status  New      PT LONG TERM GOAL #2   Title  She will report her pain as intermittant and mild  as highest pain level    Time  8    Period  Weeks    Status  New      PT LONG TERM GOAL #3   Title  She will report able to stand and work full day with mild to no pain    Time  8    Period  Weeks    Status  New      PT LONG TERM GOAL #4   Title  She will be able to do all home tasks with mild to no pain.    Time  8    Period   Weeks    Status  New      PT LONG TERM GOAL #5   Title  FOTO score will decrease to 35% limited    Time  8    Period  Weeks    Status  New            Plan - 06/30/19 1047    Clinical Impression Statement  Back pain eased post session with exercises. Hips sore with stretching and this stiffness must impact spine.   . Hope hips can get looser and improve movement and tolerance for work and home tasks    PT Treatment/Interventions  Occupational hygienist Heat;Therapeutic exercise;Therapeutic activities;Patient/family education;Manual techniques;Dry needling;Passive range of motion;Taping    PT Next Visit Plan  Manual and modalities,   Review and expand HEP    PT Home Exercise Plan  LTR , PPT,  hip abduction butterfly and legs flat, flexion trunk roll to sacrum.    Consulted and Agree with Plan of Care  Patient       Patient will benefit from skilled therapeutic intervention in order to improve the following deficits and impairments:  Decreased range of motion, Difficulty walking, Pain, Increased muscle spasms, Decreased activity tolerance, Postural dysfunction, Decreased strength  Visit Diagnosis: Joint stiffness of spine  Muscle spasm of back  Stiffness of right hip, not elsewhere classified  Stiffness of left hip, not elsewhere classified  Sciatica, left side  Abnormal posture     Problem List Patient Active Problem List   Diagnosis Date Noted  . Spider veins of both lower extremities 10/18/2015  . Tobacco dependence 02/15/2015  . Prehypertension 02/15/2015  . INSOMNIA 07/07/2010  . VARICOSE VEINS, LOWER EXTREMITIES 02/17/2010  . Asthma 02/17/2010  . PAP SMEAR, ABNORMAL, ASCUS 02/07/2009  . FIBROIDS, UTERUS 11/11/1998    Darrel Hoover  PT 06/30/2019, 12:53 PM  [facility] [address] [place], [place], [address] Phone: [phone]   Fax:  [phone]  Name: Brianna Jenkins MRN: [id number] Date  of Birth: [birth year]

## 2019-07-02 ENCOUNTER — Ambulatory Visit: Payer: 59

## 2019-07-06 DIAGNOSIS — Z8616 Personal history of COVID-19: Secondary | ICD-10-CM | POA: Diagnosis not present

## 2019-07-06 DIAGNOSIS — I10 Essential (primary) hypertension: Secondary | ICD-10-CM | POA: Diagnosis not present

## 2019-07-06 DIAGNOSIS — E7800 Pure hypercholesterolemia, unspecified: Secondary | ICD-10-CM | POA: Diagnosis not present

## 2019-07-06 DIAGNOSIS — J4541 Moderate persistent asthma with (acute) exacerbation: Secondary | ICD-10-CM | POA: Diagnosis not present

## 2019-07-07 ENCOUNTER — Other Ambulatory Visit: Payer: Self-pay

## 2019-07-07 ENCOUNTER — Ambulatory Visit: Payer: 59

## 2019-07-07 DIAGNOSIS — M25652 Stiffness of left hip, not elsewhere classified: Secondary | ICD-10-CM | POA: Diagnosis not present

## 2019-07-07 DIAGNOSIS — M62830 Muscle spasm of back: Secondary | ICD-10-CM

## 2019-07-07 DIAGNOSIS — R293 Abnormal posture: Secondary | ICD-10-CM

## 2019-07-07 DIAGNOSIS — M25651 Stiffness of right hip, not elsewhere classified: Secondary | ICD-10-CM | POA: Diagnosis not present

## 2019-07-07 DIAGNOSIS — M5432 Sciatica, left side: Secondary | ICD-10-CM | POA: Diagnosis not present

## 2019-07-07 DIAGNOSIS — M2560 Stiffness of unspecified joint, not elsewhere classified: Secondary | ICD-10-CM | POA: Diagnosis not present

## 2019-07-07 NOTE — Therapy (Signed)
[place], [place], [address] Phone: [phone]   Fax:  [phone]  Physical Therapy Treatment  Patient Details  Name: Brianna Jenkins MRN: [id number] Date of Birth: [birth year] Referring Provider (PT): Marchia Bond, MD   Encounter Date: 07/07/2019  PT End of Session - 07/07/19 1051    Visit Number  3    Number of Visits  16    Date for PT Re-Evaluation  08/14/19    Authorization Type  UMR    PT Start Time  1050    PT Stop Time  1132    PT Time Calculation (min)  42 min    Activity Tolerance  Patient limited by pain    Behavior During Therapy  [facility] for tasks assessed/performed       Past Medical History:  Diagnosis Date  . Asthma   . Ectopic pregnancy   . GERD (gastroesophageal reflux disease)   . Hypertension     Past Surgical History:  Procedure Laterality Date  . ECTOPIC PREGNANCY SURGERY     x 2   . SALPINGECTOMY      There were no vitals filed for this visit.  Subjective Assessment - 07/07/19 1054    Subjective  No pain today . Workewd yesterday and back was hurting .    Currently in Pain?  No/denies                       [facility] Adult PT Treatment/Exercise - 07/07/19 0001      Lumbar Exercises: Stretches   Single Knee to Chest Stretch  Right;Left;2 reps;30 seconds    Lower Trunk Rotation  3 reps;5 reps;10 seconds    Lower Trunk Rotation Limitations  RT/LT    Pelvic Tilt  15 reps;5 seconds    Other Lumbar Stretch Exercise  hooklye hip ER 2x30 sec    Other Lumbar Stretch Exercise  hip flexion , abduction with legs straight and bent  RT and LT  each      Lumbar Exercises: Aerobic   Nustep  L4 5 min UE       Lumbar Exercises: Standing   Other Standing Lumbar Exercises  flexion with roll at lower scrun x 10 20 degrees trunk flexion cued for chin tuck due to onset of neck pain earlier this AM      Manual Therapy   Passive ROM  flexion and rotation strenching RT  and LT hip    Manual Traction  each leg with osscilations x 5 min.        Lateral to medical tape pull to patella      PT Education - 07/07/19 1138    Education Details  REmoval of tpe if irritating and can leave on 2-3 days if no irritation   SLR Bridge 10-20 reps 2x/day  hold 3-5 sec RT  and LT    Person(s) Educated  Patient    Methods  Explanation;Tactile cues;Verbal cues;Handout    Comprehension  Returned demonstration;Verbalized understanding       PT Short Term Goals - 07/07/19 1137      PT SHORT TERM GOAL #1   Title  She will be indpendent with intial HEP    Status  Achieved      PT SHORT TERM GOAL #2   Title  She will report pain decreased 20-30% generally    Status  On-going      PT SHORT TERM GOAL #3   Title  She will report able to work with 10-25% les [name] for a whole shift.    Status  On-going      PT SHORT TERM GOAL #4   Title  FOTO scor ewill decr to 40%limited    Status  On-going        PT Long Term Goals - 06/22/19 1226      PT LONG TERM GOAL #1   Title  She will be indpendent with all hEP issued    Time  8    Period  Weeks    Status  New      PT LONG TERM GOAL #2   Title  She will report her pain as intermittant and mild  as highest pain level    Time  8    Period  Weeks    Status  New      PT LONG TERM GOAL #3   Title  She will report able to stand and work full day with mild to no pain    Time  8    Period  Weeks    Status  New      PT LONG TERM GOAL #4   Title  She will be able to do all home tasks with mild to no pain.    Time  8    Period  Weeks    Status  New      PT LONG TERM GOAL #5   Title  FOTO score will decrease to 35% limited    Time  8    Period  Weeks    Status  New            Plan - 07/07/19 1051    Clinical Impression Statement  She reports no pain today. Had bad pain yesterday. She was working.. She is able to do her HEP instucted in tape management and she agreed    PT Treatment/Interventions   Electrical Stimulation;Moist Heat;Therapeutic exercise;Therapeutic activities;Patient/family education;Manual techniques;Dry needling;Passive range of motion;Taping    PT Next Visit Plan  Manual and modalities,   Review and expand HEP    PT Home Exercise Plan  LTR , PPT,  hip abduction butterfly and legs flat, flexion trunk roll to sacrum.    Consulted and Agree with Plan of Care  Patient       Patient will benefit from skilled therapeutic intervention in order to improve the following deficits and impairments:  Decreased range of motion, Difficulty walking, Pain, Increased muscle spasms, Decreased activity tolerance, Postural dysfunction, Decreased strength  Visit Diagnosis: Joint stiffness of spine  Muscle spasm of back  Stiffness of right hip, not elsewhere classified  Stiffness of left hip, not elsewhere classified  Sciatica, left side  Abnormal posture     Problem List Patient Active Problem List   Diagnosis Date Noted  . Spider veins of both lower extremities 10/18/2015  . Tobacco dependence 02/15/2015  . Prehypertension 02/15/2015  . INSOMNIA 07/07/2010  . VARICOSE VEINS, LOWER EXTREMITIES 02/17/2010  . Asthma 02/17/2010  . PAP SMEAR, ABNORMAL, ASCUS 02/07/2009  . FIBROIDS, UTERUS 11/11/1998    Darrel Hoover PT 07/07/2019, 11:40 AM  [facility] [address] [place], [place], [address] Phone: [phone]   Fax:  [phone]  Name: Brianna Jenkins MRN: [id number] Date of Birth: Dec 23, 1960

## 2019-07-09 ENCOUNTER — Ambulatory Visit: Payer: 59

## 2019-07-09 ENCOUNTER — Other Ambulatory Visit: Payer: Self-pay

## 2019-07-09 DIAGNOSIS — M5432 Sciatica, left side: Secondary | ICD-10-CM | POA: Diagnosis not present

## 2019-07-09 DIAGNOSIS — M25652 Stiffness of left hip, not elsewhere classified: Secondary | ICD-10-CM | POA: Diagnosis not present

## 2019-07-09 DIAGNOSIS — M62830 Muscle spasm of back: Secondary | ICD-10-CM | POA: Diagnosis not present

## 2019-07-09 DIAGNOSIS — M2560 Stiffness of unspecified joint, not elsewhere classified: Secondary | ICD-10-CM

## 2019-07-09 DIAGNOSIS — M25651 Stiffness of right hip, not elsewhere classified: Secondary | ICD-10-CM | POA: Diagnosis not present

## 2019-07-09 DIAGNOSIS — R293 Abnormal posture: Secondary | ICD-10-CM | POA: Diagnosis not present

## 2019-07-09 NOTE — Patient Instructions (Signed)
isued sit to stand , clam supine and side lye, ab curl, prone hip ext with knee bnet and straight , 10-20 reps daily stop if pain

## 2019-07-09 NOTE — Therapy (Signed)
[place], [place], [address] Phone: [phone]   Fax:  [phone]  Physical Therapy Treatment  Patient Details  Name: Brianna Jenkins MRN: [id number] Date of Birth: 1960-09-05 Referring Provider (PT): Marchia Bond, MD   Encounter Date: 07/09/2019  PT End of Session - 07/09/19 1101    Visit Number  4    Number of Visits  16    Date for PT Re-Evaluation  08/14/19    Authorization Type  UMR    PT Start Time  1045    PT Stop Time  1130    PT Time Calculation (min)  45 min    Activity Tolerance  Patient tolerated treatment well    Behavior During Therapy  [facility] for tasks assessed/performed       Past Medical History:  Diagnosis Date  . Asthma   . Ectopic pregnancy   . GERD (gastroesophageal reflux disease)   . Hypertension     Past Surgical History:  Procedure Laterality Date  . ECTOPIC PREGNANCY SURGERY     x 2   . SALPINGECTOMY      There were no vitals filed for this visit.  Subjective Assessment - 07/09/19 1057    Subjective  Beter than last time    Currently in Pain?  No/denies                       [facility] Adult PT Treatment/Exercise - 07/09/19 0001      Lumbar Exercises: Stretches   Single Knee to Chest Stretch  Right;Left;1 rep;30 seconds    Lower Trunk Rotation  2 reps;30 seconds    Lower Trunk Rotation Limitations  RT/LT    Pelvic Tilt  10 reps;5 seconds      Lumbar Exercises: Aerobic   Nustep  L4 6  min LE       Lumbar Exercises: Supine   Bridge  15 reps    Other Supine Lumbar Exercises  part sitting  x 10 reps , side and supine clams    prone hip ext with and without knee bent  and sit to stand x 10-12 repos        See HEP instructions as pt did all with 10-15 reps       PT Education - 07/09/19 1157    Education Details  HEP    Person(s) Educated  Patient    Methods  Explanation;Demonstration;Tactile cues;Verbal cues;Handout    Comprehension   Verbalized understanding;Returned demonstration       PT Short Term Goals - 07/07/19 1137      PT SHORT TERM GOAL #1   Title  She will be indpendent with intial HEP    Status  Achieved      PT SHORT TERM GOAL #2   Title  She will report pain decreased 20-30% generally    Status  On-going      PT SHORT TERM GOAL #3   Title  She will report able to work with 10-25% les [name] for a whole shift.    Status  On-going      PT SHORT TERM GOAL #4   Title  FOTO scor ewill decr to 40%limited    Status  On-going        PT Long Term Goals - 06/22/19 1226      PT LONG TERM GOAL #1   Title  She will be indpendent with all hEP issued    Time  8    Period  Weeks    Status  New      PT LONG TERM GOAL #2   Title  She will report her pain as intermittant and mild  as highest pain level    Time  8    Period  Weeks    Status  New      PT LONG TERM GOAL #3   Title  She will report able to stand and work full day with mild to no pain    Time  8    Period  Weeks    Status  New      PT LONG TERM GOAL #4   Title  She will be able to do all home tasks with mild to no pain.    Time  8    Period  Weeks    Status  New      PT LONG TERM GOAL #5   Title  FOTO score will decrease to 35% limited    Time  8    Period  Weeks    Status  New            Plan - 07/09/19 1101    Clinical Impression Statement  No pain to start.  No need for tape today. Able to do all HEP correctly. Still with some pain iwh extended positions. Need to do the towel roll standing with flexion again and assess sacrum mobility    PT Treatment/Interventions  Electrical Stimulation;Moist Heat;Therapeutic exercise;Therapeutic activities;Patient/family education;Manual techniques;Dry needling;Passive range of motion;Taping    PT Home Exercise Plan  LTR , PPT,  hip abduction butterfly and legs flat, flexion trunk roll to sacrum.    Consulted and Agree with Plan of Care  Patient       Patient will benefit from  skilled therapeutic intervention in order to improve the following deficits and impairments:  Decreased range of motion, Difficulty walking, Pain, Increased muscle spasms, Decreased activity tolerance, Postural dysfunction, Decreased strength  Visit Diagnosis: Joint stiffness of spine  Muscle spasm of back  Stiffness of right hip, not elsewhere classified  Stiffness of left hip, not elsewhere classified     Problem List Patient Active Problem List   Diagnosis Date Noted  . Spider veins of both lower extremities 10/18/2015  . Tobacco dependence 02/15/2015  . Prehypertension 02/15/2015  . INSOMNIA 07/07/2010  . VARICOSE VEINS, LOWER EXTREMITIES 02/17/2010  . Asthma 02/17/2010  . PAP SMEAR, ABNORMAL, ASCUS 02/07/2009  . FIBROIDS, UTERUS 11/11/1998    Darrel Hoover PT 07/09/2019, 12:00 PM  [facility] [address] [place], [place], [address] Phone: [phone]   Fax:  [phone]  Name: Brianna Jenkins MRN: [id number] Date of Birth: August 02, 1960

## 2019-07-14 ENCOUNTER — Ambulatory Visit: Payer: 59 | Attending: Orthopedic Surgery

## 2019-07-14 ENCOUNTER — Other Ambulatory Visit: Payer: Self-pay

## 2019-07-14 DIAGNOSIS — M25652 Stiffness of left hip, not elsewhere classified: Secondary | ICD-10-CM | POA: Insufficient documentation

## 2019-07-14 DIAGNOSIS — R293 Abnormal posture: Secondary | ICD-10-CM | POA: Insufficient documentation

## 2019-07-14 DIAGNOSIS — M2560 Stiffness of unspecified joint, not elsewhere classified: Secondary | ICD-10-CM | POA: Insufficient documentation

## 2019-07-14 DIAGNOSIS — M25651 Stiffness of right hip, not elsewhere classified: Secondary | ICD-10-CM | POA: Diagnosis not present

## 2019-07-14 DIAGNOSIS — M62830 Muscle spasm of back: Secondary | ICD-10-CM | POA: Insufficient documentation

## 2019-07-14 NOTE — Therapy (Signed)
[place], [place], [address] Phone: [phone]   Fax:  [phone]  Physical Therapy Treatment  Patient Details  Name: Brianna Jenkins MRN: [id number] Date of Birth: [birth year] Referring Provider (PT): Marchia Bond, MD   Encounter Date: 07/14/2019  PT End of Session - 07/14/19 1056    Visit Number  5    Number of Visits  16    Date for PT Re-Evaluation  08/14/19    Authorization Type  UMR    PT Start Time  1054    PT Stop Time  1137    PT Time Calculation (min)  43 min    Activity Tolerance  Patient tolerated treatment well    Behavior During Therapy  [facility] for tasks assessed/performed       Past Medical History:  Diagnosis Date  . Asthma   . Ectopic pregnancy   . GERD (gastroesophageal reflux disease)   . Hypertension     Past Surgical History:  Procedure Laterality Date  . ECTOPIC PREGNANCY SURGERY     x 2   . SALPINGECTOMY      There were no vitals filed for this visit.  Subjective Assessment - 07/14/19 1058    Subjective  Back and Rt knee pain today, worked last night and back always worse withe next day. Sees Dr Mardelle Matte tomorrow.    Pain Score  7     Pain Location  Back    Pain Orientation  Right;Left;Lower    Pain Descriptors / Indicators  Aching    Pain Type  Chronic pain    Pain Onset  More than a month ago    Pain Frequency  Intermittent    Aggravating Factors   bending and standing for work    Pain Relieving Factors  rest , meds    Multiple Pain Sites  Yes    Pain Location  Knee    Pain Orientation  Right;Medial   tp patella tendon   Pain Descriptors / Indicators  Aching    Pain Type  Acute pain    Pain Onset  More than a month ago    Pain Frequency  Intermittent    Aggravating Factors   work                       [employer] [facility] Adult PT Treatment/Exercise - 07/14/19 0001      Lumbar Exercises: Stretches   Single Knee to Chest Stretch  Right;Left;1 rep;30  seconds    Lower Trunk Rotation  2 reps;30 seconds    Lower Trunk Rotation Limitations  RT/LT    Pelvic Tilt  15 reps;5 seconds    Other Lumbar Stretch Exercise  hooklye hip ER 2x30 sec      Lumbar Exercises: Aerobic   Nustep  L4 5  min LE       Modalities   Modalities  Electrical Stimulation      Moist Heat Therapy   Number Minutes Moist Heat  12 Minutes   with electric stim   Moist Heat Location  Lumbar Spine      Electrical Stimulation   Electrical Stimulation Location  lumbar     Electrical Stimulation Action  IFC    Electrical Stimulation Parameters  to tolerance    Electrical Stimulation Goals  Pain               PT Short Term Goals - 07/14/19 1101      PT  SHORT TERM GOAL #1   Title  She will be indpendent with intial HEP    Status  Achieved      PT SHORT TERM GOAL #2   Title  She will report pain decreased 20-30% generally    Status  On-going      PT SHORT TERM GOAL #3   Title  She will report able to work with 10-25% les [name] for a whole shift.    Status  On-going      PT SHORT TERM GOAL #4   Title  FOTO scor ewill decr to 40%limited    Status  Unable to assess        PT Long Term Goals - 06/22/19 1226      PT LONG TERM GOAL #1   Title  She will be indpendent with all hEP issued    Time  8    Period  Weeks    Status  New      PT LONG TERM GOAL #2   Title  She will report her pain as intermittant and mild  as highest pain level    Time  8    Period  Weeks    Status  New      PT LONG TERM GOAL #3   Title  She will report able to stand and work full day with mild to no pain    Time  8    Period  Weeks    Status  New      PT LONG TERM GOAL #4   Title  She will be able to do all home tasks with mild to no pain.    Time  8    Period  Weeks    Status  New      PT LONG TERM GOAL #5   Title  FOTO score will decrease to 35% limited    Time  8    Period  Weeks    Status  New            Plan - 07/14/19 1101    Clinical  Impression Statement  Work always flares up pain. No changes.   Bette rpost heat and stim. Patlella tracking may be causing knee pain.   Repetative bending at work increases pain.  Hip tighness may also contribute to posture and stress to back.    PT Treatment/Interventions  Electrical Stimulation;Moist Heat;Therapeutic exercise;Therapeutic activities;Patient/family education;Manual techniques;Dry needling;Passive range of motion;Taping    PT Next Visit Plan  Manual and modalities,   Review and expand HEP to also include QS and SLR    PT Home Exercise Plan  LTR , PPT,  hip abduction butterfly and legs flat, flexion trunk roll to sacrum.    Consulted and Agree with Plan of Care  Patient       Patient will benefit from skilled therapeutic intervention in order to improve the following deficits and impairments:  Decreased range of motion, Difficulty walking, Pain, Increased muscle spasms, Decreased activity tolerance, Postural dysfunction, Decreased strength  Visit Diagnosis: Joint stiffness of spine  Muscle spasm of back  Stiffness of right hip, not elsewhere classified  Stiffness of left hip, not elsewhere classified  Abnormal posture     Problem List Patient Active Problem List   Diagnosis Date Noted  . Spider veins of both lower extremities 10/18/2015  . Tobacco dependence 02/15/2015  . Prehypertension 02/15/2015  . INSOMNIA 07/07/2010  . VARICOSE VEINS, LOWER EXTREMITIES 02/17/2010  . Asthma 02/17/2010  .  PAP SMEAR, ABNORMAL, ASCUS 02/07/2009  . FIBROIDS, UTERUS 11/11/1998    Darrel Hoover PT  07/14/2019, 11:26 AM  [facility] [address] [place], [place], [address] Phone: [phone]   Fax:  [phone]  Name: Kaprisha Reidel MRN: [id number] Date of Birth: 11-19-1960

## 2019-07-15 DIAGNOSIS — M1611 Unilateral primary osteoarthritis, right hip: Secondary | ICD-10-CM | POA: Diagnosis not present

## 2019-07-15 DIAGNOSIS — M25561 Pain in right knee: Secondary | ICD-10-CM | POA: Diagnosis not present

## 2019-07-15 DIAGNOSIS — M5431 Sciatica, right side: Secondary | ICD-10-CM | POA: Diagnosis not present

## 2019-07-16 ENCOUNTER — Ambulatory Visit: Payer: 59

## 2019-07-16 ENCOUNTER — Other Ambulatory Visit: Payer: Self-pay

## 2019-07-16 DIAGNOSIS — M2560 Stiffness of unspecified joint, not elsewhere classified: Secondary | ICD-10-CM | POA: Diagnosis not present

## 2019-07-16 DIAGNOSIS — M62830 Muscle spasm of back: Secondary | ICD-10-CM

## 2019-07-16 DIAGNOSIS — R293 Abnormal posture: Secondary | ICD-10-CM | POA: Diagnosis not present

## 2019-07-16 DIAGNOSIS — M25652 Stiffness of left hip, not elsewhere classified: Secondary | ICD-10-CM

## 2019-07-16 DIAGNOSIS — M25651 Stiffness of right hip, not elsewhere classified: Secondary | ICD-10-CM | POA: Diagnosis not present

## 2019-07-16 NOTE — Therapy (Signed)
[place], [place], [address] Phone: [phone]   Fax:  [phone]  Physical Therapy Treatment  Patient Details  Name: Brianna Jenkins MRN: [id number] Date of Birth: 12-08-1960 Referring Provider (PT): Marchia Bond, MD   Encounter Date: 07/16/2019  PT End of Session - 07/16/19 1054    Visit Number  6    Number of Visits  16    Date for PT Re-Evaluation  08/14/19    Authorization Type  UMR    PT Start Time  1055   late   PT Stop Time  1133    PT Time Calculation (min)  38 min    Activity Tolerance  Patient tolerated treatment well    Behavior During Therapy  [facility] for tasks assessed/performed       Past Medical History:  Diagnosis Date  . Asthma   . Ectopic pregnancy   . GERD (gastroesophageal reflux disease)   . Hypertension     Past Surgical History:  Procedure Laterality Date  . ECTOPIC PREGNANCY SURGERY     x 2   . SALPINGECTOMY      There were no vitals filed for this visit.  Subjective Assessment - 07/16/19 1111    Subjective  No pain today. MD said Oa RT hip may be contributing to knee and back pain. Injection to RT knee and it feels better    Currently in Pain?  No/denies                       [facility] Adult PT Treatment/Exercise - 07/16/19 0001      Lumbar Exercises: Stretches   Single Knee to Chest Stretch  Right;Left;2 reps;30 seconds    Double Knee to Chest Stretch  1 rep;30 seconds    Lower Trunk Rotation  2 reps;30 seconds    Lower Trunk Rotation Limitations  RT/LT    Other Lumbar Stretch Exercise  hooklye hip ER 2x30 sec  then 60 sec legs flat abduction.         Lumbar Exercises: Aerobic   Nustep  L4 5  min LE       Lumbar Exercises: Supine   Pelvic Tilt  15 reps    Pelvic Tilt Limitations  last 5 with RT/ LT hip extension to bed.     Bridge  15 reps    Bridge Limitations  2 sets      Lumbar Exercises: Sidelying   Clam  Right;Left;10 reps    Clam  Limitations  cued for ROM limits due to pain               PT Short Term Goals - 07/14/19 1101      PT SHORT TERM GOAL #1   Title  She will be indpendent with intial HEP    Status  Achieved      PT SHORT TERM GOAL #2   Title  She will report pain decreased 20-30% generally    Status  On-going      PT SHORT TERM GOAL #3   Title  She will report able to work with 10-25% les [name] for a whole shift.    Status  On-going      PT SHORT TERM GOAL #4   Title  FOTO scor ewill decr to 40%limited    Status  Unable to assess        PT Long Term Goals - 06/22/19 1226  PT LONG TERM GOAL #1   Title  She will be indpendent with all hEP issued    Time  8    Period  Weeks    Status  New      PT LONG TERM GOAL #2   Title  She will report her pain as intermittant and mild  as highest pain level    Time  8    Period  Weeks    Status  New      PT LONG TERM GOAL #3   Title  She will report able to stand and work full day with mild to no pain    Time  8    Period  Weeks    Status  New      PT LONG TERM GOAL #4   Title  She will be able to do all home tasks with mild to no pain.    Time  8    Period  Weeks    Status  New      PT LONG TERM GOAL #5   Title  FOTO score will decrease to 35% limited    Time  8    Period  Weeks    Status  New            Plan - 07/16/19 1112    Clinical Impression Statement  Spent time showing Ms Blot OA of knee and hip so she understands what i meads and how can impact pain na stiffness. Will need to do more manual for  hiup ROm anmd add to HEp for strenfth    PT Treatment/Interventions  Electrical Stimulation;Moist Heat;Therapeutic exercise;Therapeutic activities;Patient/family education;Manual techniques;Dry needling;Passive range of motion;Taping    PT Next Visit Plan  Manual and modalities,   Review and expand HEP to also include QS and SLR,  mobs and stretch to hips.    PT Home Exercise Plan  LTR , PPT,  hip abduction  butterfly and legs flat, flexion trunk roll to sacrum.    Consulted and Agree with Plan of Care  Patient       Patient will benefit from skilled therapeutic intervention in order to improve the following deficits and impairments:  Decreased range of motion, Difficulty walking, Pain, Increased muscle spasms, Decreased activity tolerance, Postural dysfunction, Decreased strength  Visit Diagnosis: Muscle spasm of back  Joint stiffness of spine  Stiffness of right hip, not elsewhere classified  Stiffness of left hip, not elsewhere classified  Abnormal posture     Problem List Patient Active Problem List   Diagnosis Date Noted  . Spider veins of both lower extremities 10/18/2015  . Tobacco dependence 02/15/2015  . Prehypertension 02/15/2015  . INSOMNIA 07/07/2010  . VARICOSE VEINS, LOWER EXTREMITIES 02/17/2010  . Asthma 02/17/2010  . PAP SMEAR, ABNORMAL, ASCUS 02/07/2009  . FIBROIDS, UTERUS 11/11/1998    Darrel Hoover  PT 07/16/2019, 11:35 AM  [facility] [address] [place], [place], [address] Phone: [phone]   Fax:  [phone]  Name: Mahsa Begnoche MRN: [id number] Date of Birth: [birth year]

## 2019-07-21 ENCOUNTER — Ambulatory Visit: Payer: 59

## 2019-07-22 MED FILL — PANTOPRAZOLE SOD DR 20 MG T: 20 | 90 days supply | Qty: 90 | Fill #2

## 2019-07-22 MED FILL — HYDROCHLOROTHIAZIDE 25 MG T: 25 | 90 days supply | Qty: 90 | Fill #0

## 2019-07-22 MED FILL — BREO ELLIPTA 100-25 MCG INH: 100-25 | 30 days supply | Qty: 60 | Fill #5

## 2019-07-22 MED FILL — AMLODIPINE BESYLATE 5 MG TA: 5 | 90 days supply | Qty: 90 | Fill #0

## 2019-07-22 MED FILL — MELOXICAM 15 MG TABLET: 15 | 30 days supply | Qty: 30 | Fill #0

## 2019-07-23 ENCOUNTER — Ambulatory Visit: Payer: 59

## 2019-08-04 ENCOUNTER — Other Ambulatory Visit: Payer: Self-pay

## 2019-08-04 ENCOUNTER — Ambulatory Visit: Payer: 59

## 2019-08-04 DIAGNOSIS — M25651 Stiffness of right hip, not elsewhere classified: Secondary | ICD-10-CM

## 2019-08-04 DIAGNOSIS — R293 Abnormal posture: Secondary | ICD-10-CM | POA: Diagnosis not present

## 2019-08-04 DIAGNOSIS — M2560 Stiffness of unspecified joint, not elsewhere classified: Secondary | ICD-10-CM | POA: Diagnosis not present

## 2019-08-04 DIAGNOSIS — M25652 Stiffness of left hip, not elsewhere classified: Secondary | ICD-10-CM | POA: Diagnosis not present

## 2019-08-04 DIAGNOSIS — M62830 Muscle spasm of back: Secondary | ICD-10-CM | POA: Diagnosis not present

## 2019-08-04 NOTE — Therapy (Signed)
[place], [place], [address] Phone: [phone]   Fax:  [phone]  Physical Therapy Treatment  Patient Details  Name: Brianna Jenkins MRN: [id number] Date of Birth: 06/10/1961 Referring Provider (PT): Marchia Bond, MD   Encounter Date: 08/04/2019  PT End of Session - 08/04/19 0956    Visit Number  7    Number of Visits  16    Authorization Type  UMR    PT Start Time  [id number]    PT Stop Time  1040    PT Time Calculation (min)  43 min    Activity Tolerance  Patient tolerated treatment well    Behavior During Therapy  [facility] for tasks assessed/performed       Past Medical History:  Diagnosis Date  . Asthma   . Ectopic pregnancy   . GERD (gastroesophageal reflux disease)   . Hypertension     Past Surgical History:  Procedure Laterality Date  . ECTOPIC PREGNANCY SURGERY     x 2   . SALPINGECTOMY      There were no vitals filed for this visit.                    [facility] Adult PT Treatment/Exercise - 08/04/19 0001      Lumbar Exercises: Stretches   Single Knee to Chest Stretch  Right;Left;2 reps;30 seconds    Lower Trunk Rotation  2 reps;30 seconds    Lower Trunk Rotation Limitations  RT/LT      Lumbar Exercises: Aerobic   Nustep  L4 6  min LE       Lumbar Exercises: Supine   Pelvic Tilt  15 reps    Bridge  20 reps;5 seconds    Other Supine Lumbar Exercises  Ball squeeze x 20  then clam x 20 black band       Moist Heat Therapy   Number Minutes Moist Heat  12 Minutes    Moist Heat Location  Lumbar Spine;Hip   RT with exercises     Manual Therapy   Manual Therapy  Joint mobilization    Joint Mobilization  MWM belt distraction at hip Rt.     Passive ROM  all planes RT hip     Manual Traction  RT hip long axix traction,          QS and SLR x 10       PT Education - 08/04/19 1044    Education Details  HEP    Person(s) Educated  Patient    Methods   Explanation;Demonstration;Tactile cues;Verbal cues;Handout    Comprehension  Returned demonstration;Verbalized understanding       PT Short Term Goals - 07/14/19 1101      PT SHORT TERM GOAL #1   Title  She will be indpendent with intial HEP    Status  Achieved      PT SHORT TERM GOAL #2   Title  She will report pain decreased 20-30% generally    Status  On-going      PT SHORT TERM GOAL #3   Title  She will report able to work with 10-25% les [name] for a whole shift.    Status  On-going      PT SHORT TERM GOAL #4   Title  FOTO scor ewill decr to 40%limited    Status  Unable to assess        PT Long Term Goals - 06/22/19 1226  PT LONG TERM GOAL #1   Title  She will be indpendent with all hEP issued    Time  8    Period  Weeks    Status  New      PT LONG TERM GOAL #2   Title  She will report her pain as intermittant and mild  as highest pain level    Time  8    Period  Weeks    Status  New      PT LONG TERM GOAL #3   Title  She will report able to stand and work full day with mild to no pain    Time  8    Period  Weeks    Status  New      PT LONG TERM GOAL #4   Title  She will be able to do all home tasks with mild to no pain.    Time  8    Period  Weeks    Status  New      PT LONG TERM GOAL #5   Title  FOTO score will decrease to 35% limited    Time  8    Period  Weeks    Status  New            Plan - 08/04/19 1003    Clinical Impression Statement  RT hip painn today pain with ROM all directions. LT hip mild intermittant pain today. She thinks she may need RT hip injetion. She felt better post heat.    PT Treatment/Interventions  Electrical Stimulation;Moist Heat;Therapeutic exercise;Therapeutic activities;Patient/family education;Manual techniques;Dry needling;Passive range of motion;Taping    PT Next Visit Plan  Manual and modalities,   Review and expand HEP to also include QS and SLR,  mobs and stretch to hips.  FOTO    PT Home Exercise Plan   LTR , PPT,  hip abduction butterfly and legs flat, flexion trunk roll to sacrum.  QS , SLR, adductor squeeze, clam with band supine    Consulted and Agree with Plan of Care  Patient       Patient will benefit from skilled therapeutic intervention in order to improve the following deficits and impairments:  Decreased range of motion, Difficulty walking, Pain, Increased muscle spasms, Decreased activity tolerance, Postural dysfunction, Decreased strength  Visit Diagnosis: Muscle spasm of back  Joint stiffness of spine  Stiffness of right hip, not elsewhere classified  Stiffness of left hip, not elsewhere classified  Abnormal posture     Problem List Patient Active Problem List   Diagnosis Date Noted  . Spider veins of both lower extremities 10/18/2015  . Tobacco dependence 02/15/2015  . Prehypertension 02/15/2015  . INSOMNIA 07/07/2010  . VARICOSE VEINS, LOWER EXTREMITIES 02/17/2010  . Asthma 02/17/2010  . PAP SMEAR, ABNORMAL, ASCUS 02/07/2009  . FIBROIDS, UTERUS 11/11/1998    Darrel Hoover PT  08/04/2019, 10:57 AM  [facility] [address] [place], [place], [address] Phone: [phone]   Fax:  [phone]  Name: Brianna Jenkins MRN: [id number] Date of Birth: 1960/09/02

## 2019-08-04 NOTE — Patient Instructions (Signed)
QS, SLR clam , ball squeeze x 10-20 reps daily hold 5 sec RT /LT

## 2019-08-11 ENCOUNTER — Ambulatory Visit: Payer: 59 | Attending: Orthopedic Surgery

## 2019-08-11 ENCOUNTER — Other Ambulatory Visit: Payer: Self-pay

## 2019-08-11 DIAGNOSIS — M25651 Stiffness of right hip, not elsewhere classified: Secondary | ICD-10-CM | POA: Insufficient documentation

## 2019-08-11 DIAGNOSIS — M2560 Stiffness of unspecified joint, not elsewhere classified: Secondary | ICD-10-CM | POA: Insufficient documentation

## 2019-08-11 DIAGNOSIS — M25652 Stiffness of left hip, not elsewhere classified: Secondary | ICD-10-CM | POA: Diagnosis not present

## 2019-08-11 DIAGNOSIS — M62830 Muscle spasm of back: Secondary | ICD-10-CM | POA: Diagnosis not present

## 2019-08-11 DIAGNOSIS — R293 Abnormal posture: Secondary | ICD-10-CM | POA: Diagnosis not present

## 2019-08-11 NOTE — Therapy (Signed)
[place], [place], [address] Phone: [phone]   Fax:  [phone]  Physical Therapy Treatment  Patient Details  Name: Brianna Jenkins MRN: [id number] Date of Birth: 1960-07-25 Referring Provider (PT): Marchia Bond, MD   Encounter Date: 08/11/2019  PT End of Session - 08/11/19 1058    Visit Number  8    Number of Visits  16    Date for PT Re-Evaluation  08/14/19    Authorization Type  UMR    PT Start Time  1000   LATE 13 min   PT Stop Time  1038    PT Time Calculation (min)  38 min    Activity Tolerance  Patient tolerated treatment well;Patient limited by pain    Behavior During Therapy  [facility] for tasks assessed/performed       Past Medical History:  Diagnosis Date  . Asthma   . Ectopic pregnancy   . GERD (gastroesophageal reflux disease)   . Hypertension     Past Surgical History:  Procedure Laterality Date  . ECTOPIC PREGNANCY SURGERY     x 2   . SALPINGECTOMY      There were no vitals filed for this visit.  Subjective Assessment - 08/11/19 1059    Subjective  bilateral hip pain    Pain Score  4     Pain Location  Hip    Pain Orientation  Right;Left    Pain Descriptors / Indicators  Aching    Pain Type  Chronic pain    Pain Onset  More than a month ago    Pain Frequency  Intermittent    Aggravating Factors   work, bend and stand    Pain Relieving Factors  rest,meds                       [facility] Adult PT Treatment/Exercise - 08/11/19 0001      Lumbar Exercises: Stretches   Single Knee to Chest Stretch  Right;Left;2 reps;30 seconds    Lower Trunk Rotation  2 reps;30 seconds    Other Lumbar Stretch Exercise  hooklye hip ER 2x30 sec  then 60 sec legs flat abduction.         Lumbar Exercises: Aerobic   Nustep  LE L$ 5 min      Lumbar Exercises: Supine   Pelvic Tilt  15 reps    Clam  10 reps    Clam Limitations  RT/LT  with PPT    Bridge  10 reps    Bridge  Limitations  with PPT    Other Supine Lumbar Exercises  legs 90/90 on chair with PPT and sacral lift  and heels dig into chair  10 sec 5 reps    Other Supine Lumbar Exercises  Ball squeeze x 20  then clam x 20 black band              PT Education - 08/11/19 1136    Education Details  HEP    Person(s) Educated  Patient    Methods  Explanation;Tactile cues;Verbal cues;Handout    Comprehension  Verbalized understanding;Returned demonstration       PT Short Term Goals - 07/14/19 1101      PT SHORT TERM GOAL #1   Title  She will be indpendent with intial HEP    Status  Achieved      PT SHORT TERM GOAL #2   Title  She will report pain decreased  20-30% generally    Status  On-going      PT SHORT TERM GOAL #3   Title  She will report able to work with 10-25% les [name] for a whole shift.    Status  On-going      PT SHORT TERM GOAL #4   Title  FOTO scor ewill decr to 40%limited    Status  Unable to assess        PT Long Term Goals - 06/22/19 1226      PT LONG TERM GOAL #1   Title  She will be indpendent with all hEP issued    Time  8    Period  Weeks    Status  New      PT LONG TERM GOAL #2   Title  She will report her pain as intermittant and mild  as highest pain level    Time  8    Period  Weeks    Status  New      PT LONG TERM GOAL #3   Title  She will report able to stand and work full day with mild to no pain    Time  8    Period  Weeks    Status  New      PT LONG TERM GOAL #4   Title  She will be able to do all home tasks with mild to no pain.    Time  8    Period  Weeks    Status  New      PT LONG TERM GOAL #5   Title  FOTO score will decrease to 35% limited    Time  8    Period  Weeks    Status  New            Plan - 08/11/19 1059    PT Treatment/Interventions  Electrical Stimulation;Moist Heat;Therapeutic exercise;Therapeutic activities;Patient/family education;Manual techniques;Dry needling;Passive range of motion;Taping    PT Home  Exercise Plan  LTR , PPT,  hip abduction butterfly and legs flat, flexion trunk roll to sacrum.  QS , SLR, adductor squeeze, clam with band supine    Consulted and Agree with Plan of Care  Patient       Patient will benefit from skilled therapeutic intervention in order to improve the following deficits and impairments:  Decreased range of motion, Difficulty walking, Pain, Increased muscle spasms, Decreased activity tolerance, Postural dysfunction, Decreased strength  Visit Diagnosis: Muscle spasm of back  Joint stiffness of spine  Stiffness of right hip, not elsewhere classified  Stiffness of left hip, not elsewhere classified     Problem List Patient Active Problem List   Diagnosis Date Noted  . Spider veins of both lower extremities 10/18/2015  . Tobacco dependence 02/15/2015  . Prehypertension 02/15/2015  . INSOMNIA 07/07/2010  . VARICOSE VEINS, LOWER EXTREMITIES 02/17/2010  . Asthma 02/17/2010  . PAP SMEAR, ABNORMAL, ASCUS 02/07/2009  . FIBROIDS, UTERUS 11/11/1998    Darrel Hoover PT 08/11/2019, 12:37 PM  [facility] [address] [place], [place], [address] Phone: [phone]   Fax:  [phone]  Name: Emilie Kasko MRN: [id number] Date of Birth: 1961/05/06

## 2019-08-11 NOTE — Patient Instructions (Signed)
Supine 90/90 hp /knees legs on chair with PPT with wacral lif and hamstring isometrics heel into chair  5 inhale/exhale 5 reps 1-2x/day

## 2019-08-12 DIAGNOSIS — M1611 Unilateral primary osteoarthritis, right hip: Secondary | ICD-10-CM | POA: Diagnosis not present

## 2019-08-12 DIAGNOSIS — M25561 Pain in right knee: Secondary | ICD-10-CM | POA: Diagnosis not present

## 2019-08-12 DIAGNOSIS — M5431 Sciatica, right side: Secondary | ICD-10-CM | POA: Diagnosis not present

## 2019-08-13 ENCOUNTER — Ambulatory Visit: Payer: 59

## 2019-08-17 IMAGING — DX DG CHEST 2V
2 series · 2 of 2 positions shown · non-contrast
Comparison: 11/20/2013

CLINICAL DATA: Cough, shortness of breath and wheezing for 2 days.

EXAM:
CHEST - 2 VIEW

[chest pa]
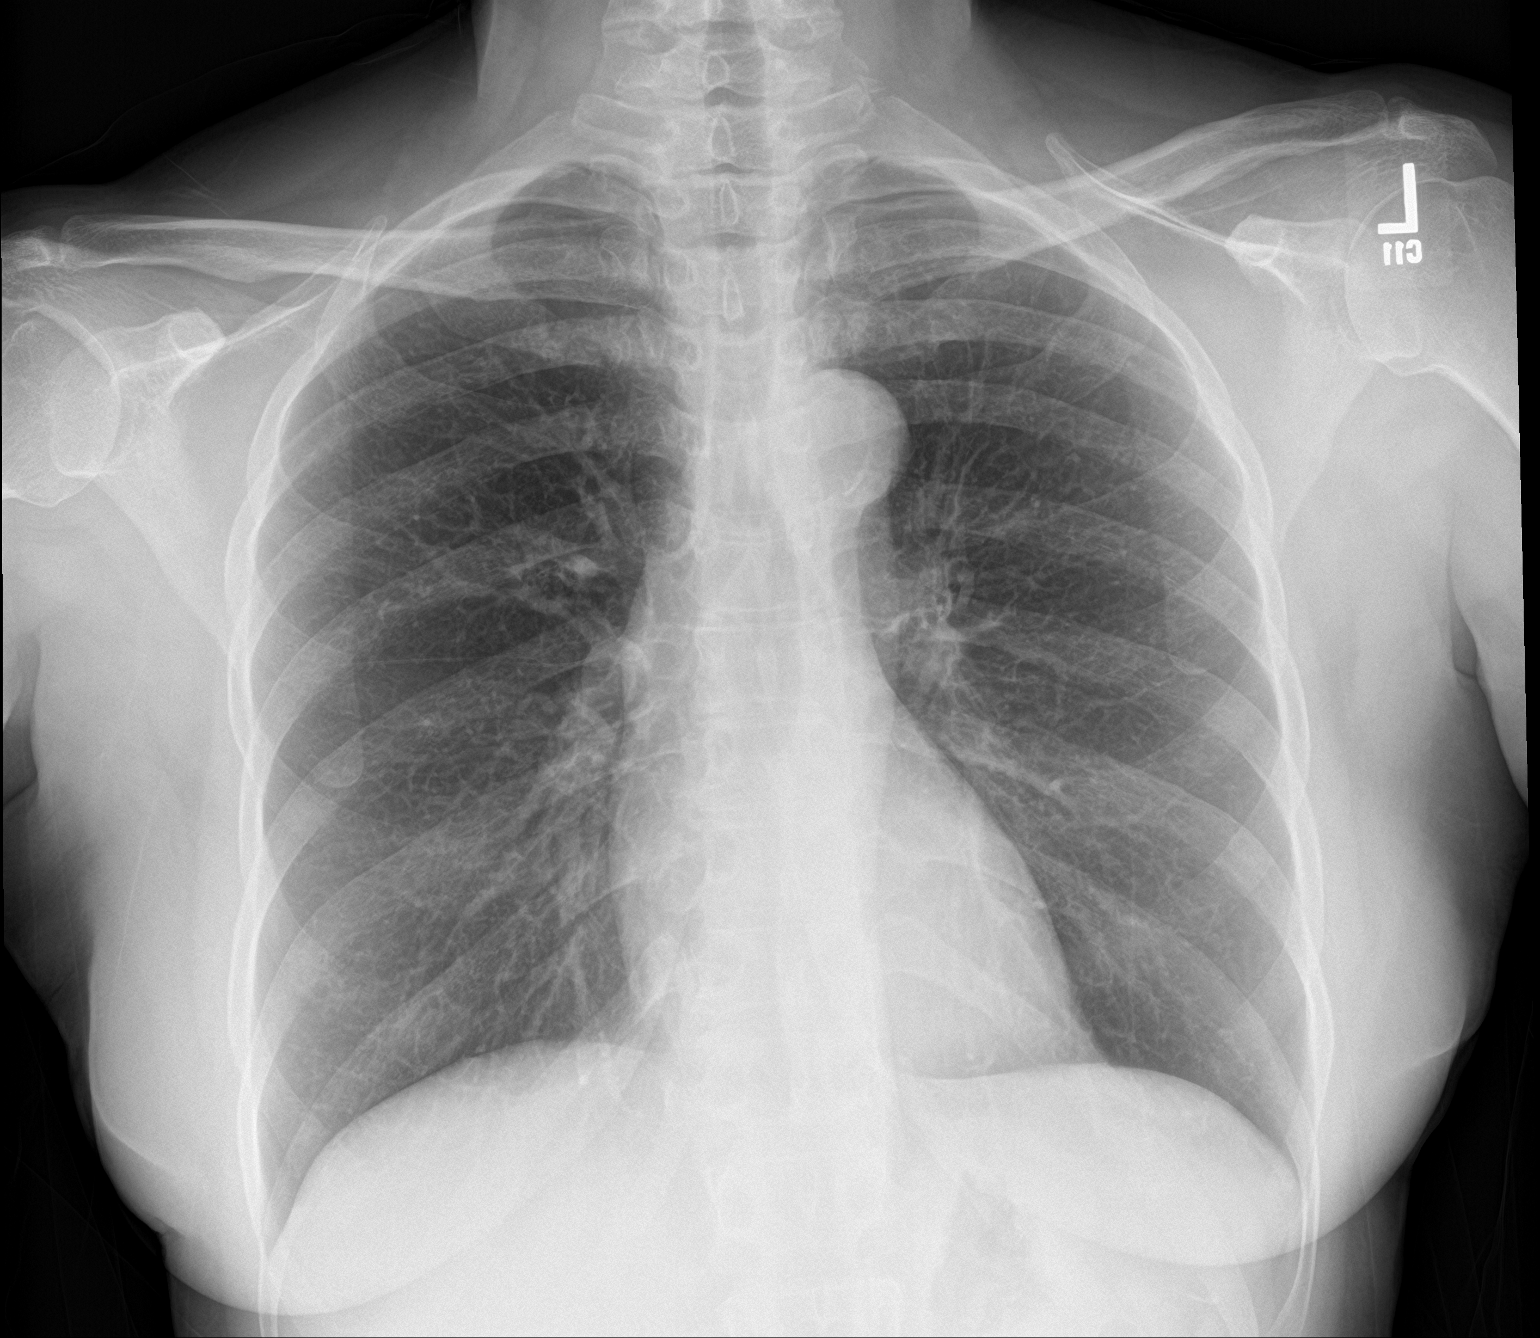

[chest lat]
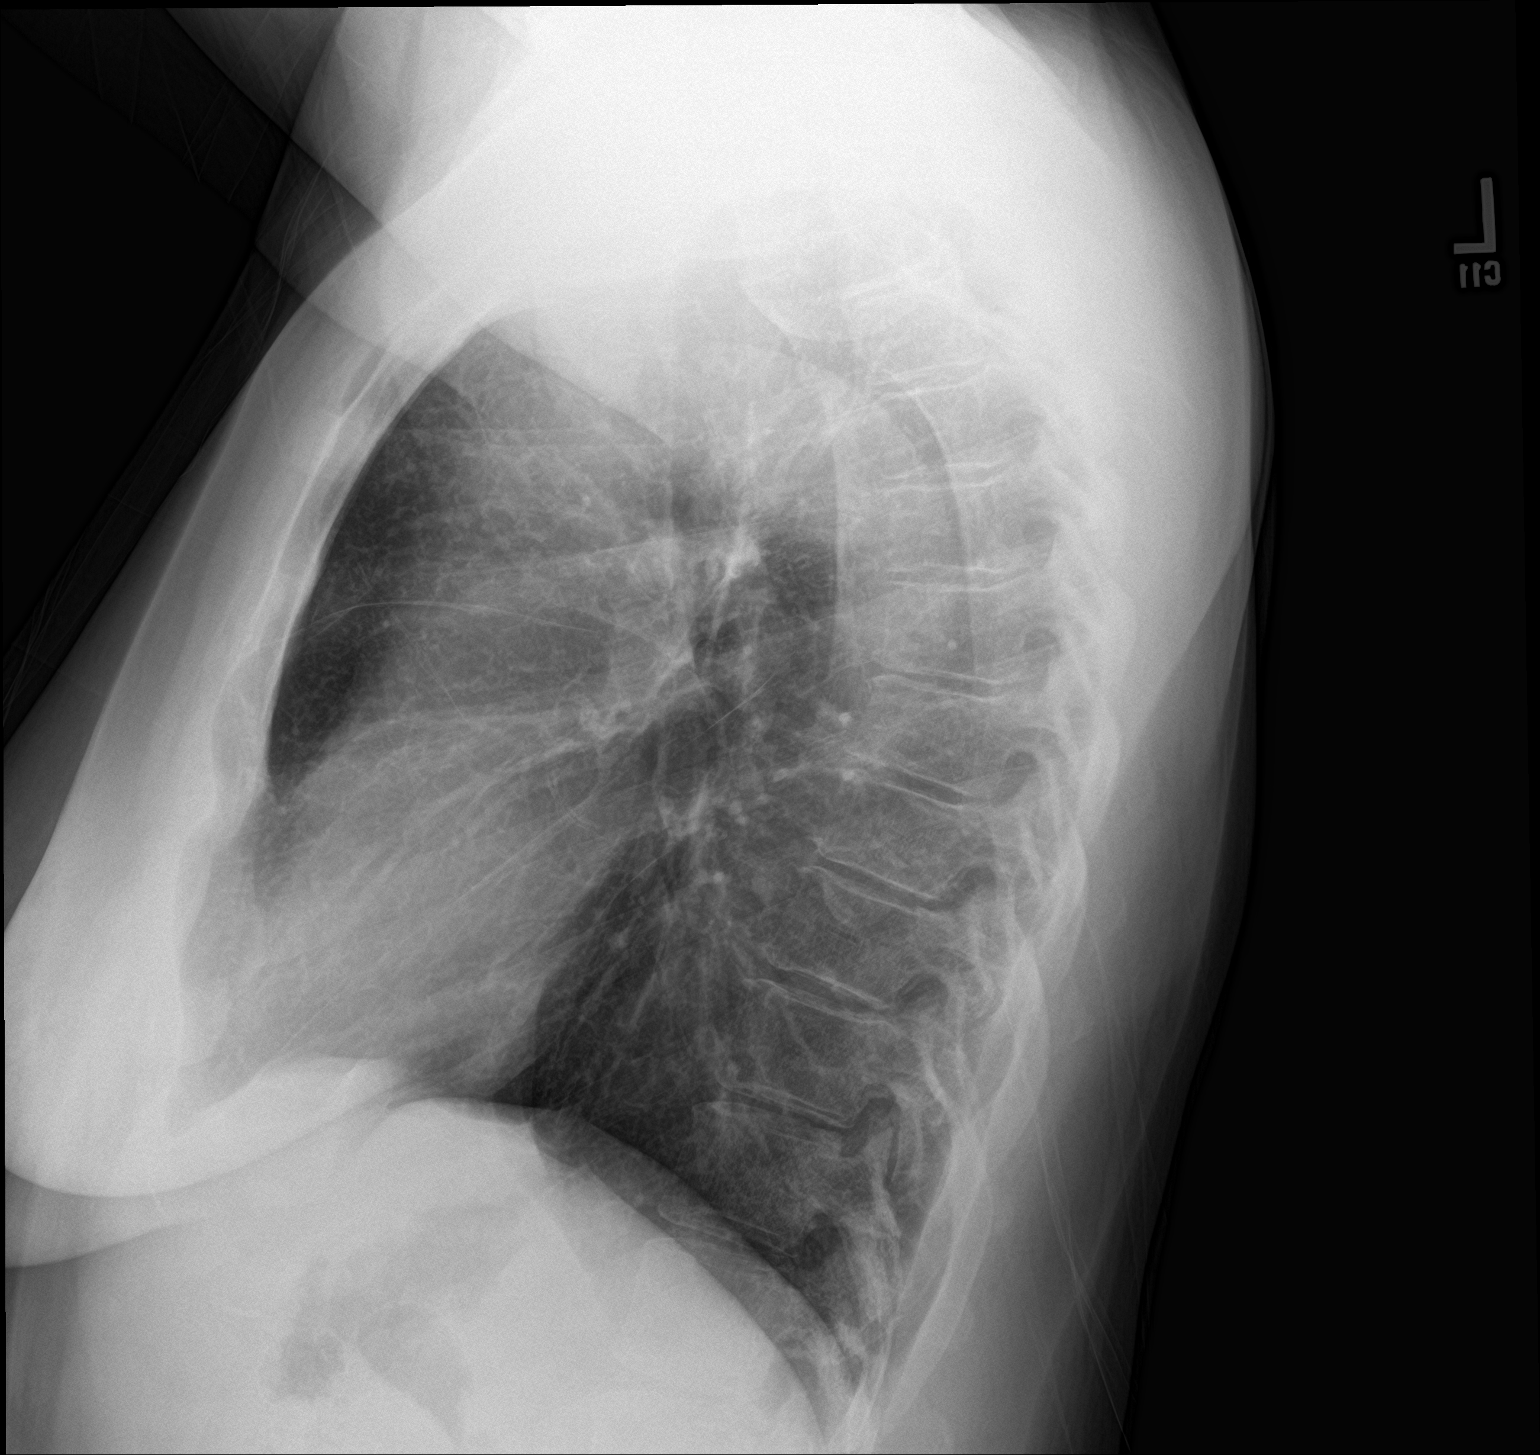

[2 of 2 positions shown; findings below may reference images not displayed]

FINDINGS: The cardiac silhouette, mediastinal and hilar contours are within
normal limits and stable. The lungs are clear. No pleural effusion.
The bony thorax is intact.
IMPRESSION: No acute cardiopulmonary findings.

## 2019-08-27 ENCOUNTER — Other Ambulatory Visit: Payer: Self-pay

## 2019-08-27 ENCOUNTER — Ambulatory Visit: Payer: 59

## 2019-08-27 DIAGNOSIS — R293 Abnormal posture: Secondary | ICD-10-CM | POA: Diagnosis not present

## 2019-08-27 DIAGNOSIS — M62830 Muscle spasm of back: Secondary | ICD-10-CM | POA: Diagnosis not present

## 2019-08-27 DIAGNOSIS — M25652 Stiffness of left hip, not elsewhere classified: Secondary | ICD-10-CM | POA: Diagnosis not present

## 2019-08-27 DIAGNOSIS — M25651 Stiffness of right hip, not elsewhere classified: Secondary | ICD-10-CM | POA: Diagnosis not present

## 2019-08-27 DIAGNOSIS — M2560 Stiffness of unspecified joint, not elsewhere classified: Secondary | ICD-10-CM

## 2019-08-27 NOTE — Therapy (Signed)
[place], [place], [address] Phone: [phone]   Fax:  [phone]  Physical Therapy Treatment  Patient Details  Name: Brianna Jenkins MRN: [id number] Date of Birth: [birth year] Referring Provider (PT): Marchia Bond, MD   Encounter Date: 08/27/2019  PT End of Session - 08/27/19 1624    Visit Number  9    Number of Visits  16    Date for PT Re-Evaluation  08/14/19    Authorization Type  UMR    PT Start Time  0417    PT Stop Time  0500    PT Time Calculation (min)  43 min    Activity Tolerance  Patient tolerated treatment well;Patient limited by pain    Behavior During Therapy  [facility] for tasks assessed/performed       Past Medical History:  Diagnosis Date  . Asthma   . Ectopic pregnancy   . GERD (gastroesophageal reflux disease)   . Hypertension     Past Surgical History:  Procedure Laterality Date  . ECTOPIC PREGNANCY SURGERY     x 2   . SALPINGECTOMY      There were no vitals filed for this visit.  Subjective Assessment - 08/27/19 1632    Subjective  She feels she is some better since starting PT . and working out so not feeling so stiff.         [facility] PT Assessment - 08/27/19 0001      AROM   Lumbar Flexion  80   2 in above toes   Lumbar Extension  10    Lumbar - Right Side Bend  15    Lumbar - Left Side Bend  15      PROM   Overall PROM Comments  112 LT 115 RT  Abduction   RT  10   LT  15  hip extension RT 0   Lt  -5        Hip IR prone RT 20    Lt   30           ER LT  20   RT 15      Flexibility   Hamstrings  RT 55  LT 45    Quadriceps  105 degrees bilaterally prone                    [facility] Adult PT Treatment/Exercise - 08/27/19 0001      Lumbar Exercises: Aerobic   Nustep  LE L4 5 min      Lumbar Exercises: Supine   Pelvic Tilt  15 reps    Pelvic Tilt Limitations  and 15 with knes on bolster    Clam  5 reps    Clam Limitations  RT/LT  with PPT    Straight Leg Raise  5 reps    Straight Leg Raises Limitations  RT?LT      Lumbar Exercises: Sidelying   Clam  Right;Left;15 reps    Hip Abduction  Right;Left;15 reps               PT Short Term Goals - 08/27/19 1632      PT SHORT TERM GOAL #1   Title  She will be indpendent with intial HEP    Status  Achieved      PT SHORT TERM GOAL #2   Title  She will report pain decreased 20-30% generally    Baseline  30%    Status  Achieved      PT SHORT TERM GOAL #3   Title  She will report able to work with 10-25% les [name] for a whole shift.    Baseline  on  light duty    Status  On-going      PT SHORT TERM GOAL #4   Title  FOTO scor ewill decr to 40%limited    Baseline  no changew    Status  On-going        PT Long Term Goals - 06/22/19 1226      PT LONG TERM GOAL #1   Title  She will be indpendent with all hEP issued    Time  8    Period  Weeks    Status  New      PT LONG TERM GOAL #2   Title  She will report her pain as intermittant and mild  as highest pain level    Time  8    Period  Weeks    Status  New      PT LONG TERM GOAL #3   Title  She will report able to stand and work full day with mild to no pain    Time  8    Period  Weeks    Status  New      PT LONG TERM GOAL #4   Title  She will be able to do all home tasks with mild to no pain.    Time  8    Period  Weeks    Status  New      PT LONG TERM GOAL #5   Title  FOTO score will decrease to 35% limited    Time  8    Period  Weeks    Status  New            Plan - 08/27/19 1624    Clinical Impression Statement  Ms Ruffino feel she is overall better though she is on light duty now at work.   She now has increased  hip flexion bilaterally. She needs increased hip mobility to decr strain to back and  hips with home and work activity.   She should improve with skilled PT and consistent HEP.    PT Frequency  2x / week    PT Duration  4 weeks    PT Treatment/Interventions  Electrical  Stimulation;Moist Heat;Therapeutic exercise;Therapeutic activities;Patient/family education;Manual techniques;Dry needling;Passive range of motion;Taping    PT Next Visit Plan  Manual and modalities,   Review and expand HEP ,  mobs and stretch to hips.    PT Home Exercise Plan  LTR , PPT,  hip abduction butterfly and legs flat, flexion trunk roll to sacrum.  QS , SLR, adductor squeeze, clam with band supine    Consulted and Agree with Plan of Care  Patient       Patient will benefit from skilled therapeutic intervention in order to improve the following deficits and impairments:  Decreased range of motion, Difficulty walking, Pain, Increased muscle spasms, Decreased activity tolerance, Postural dysfunction, Decreased strength  Visit Diagnosis: Muscle spasm of back  Joint stiffness of spine  Stiffness of right hip, not elsewhere classified  Stiffness of left hip, not elsewhere classified  Abnormal posture     Problem List Patient Active Problem List   Diagnosis Date Noted  . Spider veins of both lower extremities 10/18/2015  . Tobacco dependence 02/15/2015  . Prehypertension 02/15/2015  . INSOMNIA 07/07/2010  .  VARICOSE VEINS, LOWER EXTREMITIES 02/17/2010  . Asthma 02/17/2010  . PAP SMEAR, ABNORMAL, ASCUS 02/07/2009  . FIBROIDS, UTERUS 11/11/1998    Darrel Hoover  PT 08/27/2019, 5:20 PM  [facility] [address] [place], [place], [address] Phone: [phone]   Fax:  [phone]  Name: Brianna Jenkins MRN: [id number] Date of Birth: Nov 09, 1960

## 2019-08-28 MED FILL — BREO ELLIPTA 100-25 MCG INH: 100-25 | 30 days supply | Qty: 60 | Fill #6

## 2019-09-03 ENCOUNTER — Ambulatory Visit: Payer: 59

## 2019-09-10 ENCOUNTER — Ambulatory Visit: Payer: 59

## 2019-09-15 ENCOUNTER — Ambulatory Visit: Payer: 59 | Attending: Orthopedic Surgery

## 2019-09-15 ENCOUNTER — Other Ambulatory Visit: Payer: Self-pay

## 2019-09-15 DIAGNOSIS — R293 Abnormal posture: Secondary | ICD-10-CM | POA: Insufficient documentation

## 2019-09-15 DIAGNOSIS — M2560 Stiffness of unspecified joint, not elsewhere classified: Secondary | ICD-10-CM | POA: Insufficient documentation

## 2019-09-15 DIAGNOSIS — M25652 Stiffness of left hip, not elsewhere classified: Secondary | ICD-10-CM | POA: Diagnosis not present

## 2019-09-15 DIAGNOSIS — M5432 Sciatica, left side: Secondary | ICD-10-CM | POA: Diagnosis not present

## 2019-09-15 DIAGNOSIS — M62830 Muscle spasm of back: Secondary | ICD-10-CM | POA: Diagnosis not present

## 2019-09-15 DIAGNOSIS — M25651 Stiffness of right hip, not elsewhere classified: Secondary | ICD-10-CM | POA: Insufficient documentation

## 2019-09-15 NOTE — Therapy (Addendum)
[place], [place], [address] Phone: [phone]   Fax:  [phone]  Physical Therapy Treatment/Discharge  Patient Details  Name: Brianna Jenkins MRN: [id number] Date of Birth: 07-24-1960 Referring Provider (PT): Marchia Bond, MD   Encounter Date: 09/15/2019  PT End of Session - 09/15/19 1709    Visit Number  10    Number of Visits  16    Date for PT Re-Evaluation  08/14/19    Authorization Type  [facility]    PT Start Time  1625    PT Stop Time  1705    PT Time Calculation (min)  40 min    Activity Tolerance  Patient tolerated treatment well    Behavior During Therapy  [facility] for tasks assessed/performed       Past Medical History:  Diagnosis Date  . Asthma   . Ectopic pregnancy   . GERD (gastroesophageal reflux disease)   . Hypertension     Past Surgical History:  Procedure Laterality Date  . ECTOPIC PREGNANCY SURGERY     x 2   . SALPINGECTOMY      There were no vitals filed for this visit.  Subjective Assessment - 09/15/19 1629    Subjective  Pt reports her back is okay today, but her R knee/hip are bothering her. Light duty is helping her, but sitting longer makes her back hurt too so now she is trying to stand for at least one hour.    Diagnostic tests  xray, Degenerative changes.    Patient Stated Goals  She wants to resolve back pain.    Currently in Pain?  Yes    Pain Score  4     Pain Location  Hip    Multiple Pain Sites  Yes    Pain Location  Knee    Pain Orientation  Right                       [facility] Adult PT Treatment/Exercise - 09/15/19 0001      Lumbar Exercises: Supine   Bridge  10 reps;5 seconds    Bridge Limitations  with pelvic tilt      Manual Therapy   Manual Therapy  Joint mobilization;Soft tissue mobilization;Passive ROM;Muscle Energy Technique    Manual therapy comments  very restricted R hip ER    Joint Mobilization  R hip IR/ER mobs    Soft tissue  mobilization  piriformis, lumbar PS    Passive ROM  R hip IR/ER    Manual Traction  R hip long axis distraction grade III    Muscle Energy Technique  into IR to facilitate hip ER 5 x 6 sec holds             PT Education - 09/15/19 1707    Education Details  HEP, posture, core strength    Person(s) Educated  Patient    Methods  Explanation;Demonstration;Tactile cues;Verbal cues;Handout    Comprehension  Verbalized understanding;Returned demonstration       PT Short Term Goals - 08/27/19 1632      PT SHORT TERM GOAL #1   Title  She will be indpendent with intial HEP    Status  Achieved      PT SHORT TERM GOAL #2   Title  She will report pain decreased 20-30% generally    Baseline  30%    Status  Achieved      PT SHORT TERM GOAL #3  Title  She will report able to work with 10-25% les [name] for a whole shift.    Baseline  on  light duty    Status  On-going      PT SHORT TERM GOAL #4   Title  FOTO scor ewill decr to 40%limited    Baseline  no changew    Status  On-going        PT Long Term Goals - 06/22/19 1226      PT LONG TERM GOAL #1   Title  She will be indpendent with all hEP issued    Time  8    Period  Weeks    Status  New      PT LONG TERM GOAL #2   Title  She will report her pain as intermittant and mild  as highest pain level    Time  8    Period  Weeks    Status  New      PT LONG TERM GOAL #3   Title  She will report able to stand and work full day with mild to no pain    Time  8    Period  Weeks    Status  New      PT LONG TERM GOAL #4   Title  She will be able to do all home tasks with mild to no pain.    Time  8    Period  Weeks    Status  New      PT LONG TERM GOAL #5   Title  FOTO score will decrease to 35% limited    Time  8    Period  Weeks    Status  New            Plan - 09/15/19 1709    Clinical Impression Statement  Well tolerated tx mainly focused on manual therapy to increase R hip ROM and decrease guarding. Pt  has significant hypoextensibility and myofascial restrictions in R lumbar PS wiht guarding into R hip ER. She will benefit from stretching, postural alignment and core strengthening.    PT Frequency  2x / week    PT Duration  4 weeks    PT Treatment/Interventions  Electrical Stimulation;Moist Heat;Therapeutic exercise;Therapeutic activities;Patient/family education;Manual techniques;Dry needling;Passive range of motion;Taping    PT Next Visit Plan  Core strengthening, lumbopelvic stability, postural alignment    PT Home Exercise Plan  LTR , PPT,  hip abduction butterfly and legs flat, flexion trunk roll to sacrum.  QS , SLR, adductor squeeze, clam with band supine    Consulted and Agree with Plan of Care  Patient       Patient will benefit from skilled therapeutic intervention in order to improve the following deficits and impairments:  Decreased range of motion, Difficulty walking, Pain, Increased muscle spasms, Decreased activity tolerance, Postural dysfunction, Decreased strength  Visit Diagnosis: Muscle spasm of back  Abnormal posture  Joint stiffness of spine  Stiffness of right hip, not elsewhere classified  Stiffness of left hip, not elsewhere classified  Sciatica, left side     Problem List Patient Active Problem List   Diagnosis Date Noted  . Spider veins of both lower extremities 10/18/2015  . Tobacco dependence 02/15/2015  . Prehypertension 02/15/2015  . INSOMNIA 07/07/2010  . VARICOSE VEINS, LOWER EXTREMITIES 02/17/2010  . Asthma 02/17/2010  . PAP SMEAR, ABNORMAL, ASCUS 02/07/2009  . FIBROIDS, UTERUS 11/11/1998    Izell Smyrna, PT, DPT 09/15/2019, 5:11  PM  [place], [place], [address] Phone: [phone]   Fax:  [phone]  Name: Brianna Jenkins MRN: [id number] Date of Birth: [birth year]  PHYSICAL THERAPY DISCHARGE SUMMARY  Visits from Start of Care: 10  Current functional  level related to goals / functional outcomes: See above   Remaining deficits: See above   Education / Equipment: Anatomy of condition, POC, HEP, exercise form/rationale  Plan: Patient agrees to discharge.  Patient goals were partially met. Patient is being discharged due to not returning since the last visit.  ?????     Jessica C. Hightower PT, DPT 10/13/19 2:30 PM

## 2019-09-23 DIAGNOSIS — J301 Allergic rhinitis due to pollen: Secondary | ICD-10-CM | POA: Diagnosis not present

## 2019-09-23 DIAGNOSIS — M545 Low back pain: Secondary | ICD-10-CM | POA: Diagnosis not present

## 2019-09-23 DIAGNOSIS — M25561 Pain in right knee: Secondary | ICD-10-CM | POA: Diagnosis not present

## 2019-09-23 DIAGNOSIS — J029 Acute pharyngitis, unspecified: Secondary | ICD-10-CM | POA: Diagnosis not present

## 2019-09-23 DIAGNOSIS — J45909 Unspecified asthma, uncomplicated: Secondary | ICD-10-CM | POA: Diagnosis not present

## 2019-09-23 DIAGNOSIS — M1611 Unilateral primary osteoarthritis, right hip: Secondary | ICD-10-CM | POA: Diagnosis not present

## 2019-09-23 MED FILL — FLUTICASONE PROP 50 MCG SPR: 50 | 60 days supply | Qty: 16 | Fill #0

## 2019-09-23 MED FILL — ALBUTEROL SULFATE HFA 108 (: 108 (90 BAS | 34 days supply | Qty: 9 | Fill #0

## 2019-09-28 ENCOUNTER — Ambulatory Visit: Payer: 59

## 2019-09-28 DIAGNOSIS — I8393 Asymptomatic varicose veins of bilateral lower extremities: Secondary | ICD-10-CM | POA: Diagnosis not present

## 2019-09-28 DIAGNOSIS — J4541 Moderate persistent asthma with (acute) exacerbation: Secondary | ICD-10-CM | POA: Diagnosis not present

## 2019-09-28 MED FILL — DOXYCYCLINE HYCLATE 100 MG: 100 | 7 days supply | Qty: 14 | Fill #0

## 2019-09-28 MED FILL — BREO ELLIPTA 100-25 MCG INH: 100-25 | 30 days supply | Qty: 60 | Fill #7

## 2019-09-28 MED FILL — predniSONE 10 MG (21) TBPK: 10 | 6 days supply | Qty: 21 | Fill #0

## 2019-09-28 MED FILL — HYDROCODONE-CHLORPHEN ER SU: 10-8 | 10 days supply | Qty: 100 | Fill #0

## 2019-10-05 ENCOUNTER — Telehealth: Payer: Self-pay | Admitting: Physical Therapy

## 2019-10-05 ENCOUNTER — Ambulatory Visit: Payer: 59

## 2019-10-05 NOTE — Telephone Encounter (Signed)
Message left about missed appointment and she should call to let us know if she wants to discharge or return for PT . Told her we need to do reassessment for Dr Mardelle Matte.

## 2019-10-16 MED FILL — MELOXICAM 15 MG TABLET: 15 | 30 days supply | Qty: 30 | Fill #0

## 2019-10-16 MED FILL — AMLODIPINE BESYLATE 5 MG TA: 5 | 90 days supply | Qty: 90 | Fill #1

## 2019-10-16 MED FILL — PANTOPRAZOLE SOD DR 20 MG T: 20 | 90 days supply | Qty: 90 | Fill #0

## 2019-10-16 MED FILL — BREO ELLIPTA 100-25 MCG INH: 100-25 | 30 days supply | Qty: 60 | Fill #7

## 2019-10-16 MED FILL — HYDROCHLOROTHIAZIDE 25 MG T: 25 | 90 days supply | Qty: 90 | Fill #1

## 2019-11-06 MED FILL — predniSONE 10 MG (21) TBPK: 10 | 6 days supply | Qty: 21 | Fill #0

## 2019-12-02 MED FILL — PROAIR RESPICLICK INHAL PWD: 108 (90 BAS | 30 days supply | Qty: 1 | Fill #0

## 2019-12-02 MED FILL — BREO ELLIPTA 100-25 MCG INH: 100-25 | 30 days supply | Qty: 60 | Fill #0

## 2019-12-23 DIAGNOSIS — M1611 Unilateral primary osteoarthritis, right hip: Secondary | ICD-10-CM | POA: Diagnosis not present

## 2019-12-23 DIAGNOSIS — M25561 Pain in right knee: Secondary | ICD-10-CM | POA: Diagnosis not present

## 2019-12-23 MED FILL — METHYLPREDNISOLONE 4 MG TBP: 4 | 6 days supply | Qty: 21 | Fill #0

## 2019-12-23 MED FILL — MELOXICAM 15 MG TABLET: 15 | 30 days supply | Qty: 30 | Fill #0

## 2020-01-25 MED FILL — HYDROCHLOROTHIAZIDE 25 MG T: 25 | 30 days supply | Qty: 30 | Fill #2

## 2020-01-25 MED FILL — AMLODIPINE BESYLATE 5 MG TA: 5 | 30 days supply | Qty: 30 | Fill #2

## 2020-01-28 ENCOUNTER — Other Ambulatory Visit (HOSPITAL_COMMUNITY): Payer: Self-pay | Admitting: Internal Medicine

## 2020-01-28 MED FILL — predniSONE 10 MG (21) TBPK: 10 | 6 days supply | Qty: 21 | Fill #0

## 2020-02-16 MED FILL — PROAIR RESPICLICK INHAL PWD: 108 (90 BAS | 30 days supply | Qty: 1 | Fill #0

## 2020-02-17 MED FILL — AMLODIPINE BESYLATE 5 MG TA: 5 | 30 days supply | Qty: 30 | Fill #3

## 2020-02-17 MED FILL — PANTOPRAZOLE SOD DR 20 MG T: 20 | 30 days supply | Qty: 30 | Fill #1

## 2020-02-17 MED FILL — HYDROCHLOROTHIAZIDE 25 MG T: 25 | 30 days supply | Qty: 30 | Fill #3

## 2020-02-18 MED FILL — predniSONE 10 MG (21) TBPK: 10 | 6 days supply | Qty: 21 | Fill #0 | Status: TO

## 2020-02-18 MED FILL — predniSONE 10 MG TABS: 10 | 6 days supply | Qty: 21 | Fill #0

## 2020-03-25 MED FILL — HYDROCHLOROTHIAZIDE 25 MG T: 25 | 30 days supply | Qty: 30 | Fill #4

## 2020-03-25 MED FILL — BREO ELLIPTA 100-25 MCG INH: 100-25 | 30 days supply | Qty: 60 | Fill #0

## 2020-03-25 MED FILL — PANTOPRAZOLE SOD DR 20 MG T: 20 | 30 days supply | Qty: 30 | Fill #2

## 2020-03-25 MED FILL — PROAIR RESPICLICK INHAL PWD: 108 (90 BAS | 30 days supply | Qty: 1 | Fill #1

## 2020-03-25 MED FILL — AMLODIPINE BESYLATE 5 MG TA: 5 | 30 days supply | Qty: 30 | Fill #4

## 2020-05-07 MED FILL — PANTOPRAZOLE SOD DR 20 MG T: 20 | 30 days supply | Qty: 30 | Fill #3

## 2020-05-07 MED FILL — AMLODIPINE BESYLATE 5 MG TA: 5 | 30 days supply | Qty: 30 | Fill #5

## 2020-05-07 MED FILL — HYDROCHLOROTHIAZIDE 25 MG T: 25 | 30 days supply | Qty: 30 | Fill #5

## 2020-05-13 IMAGING — CR DG LUMBAR SPINE 2-3V
3 series · 3 of 3 positions shown · non-contrast
Comparison: None.

CLINICAL DATA: Low back pain with left-sided sciatica

EXAM:
LUMBAR SPINE - 2-3 VIEW

[t l-spine a.p.]
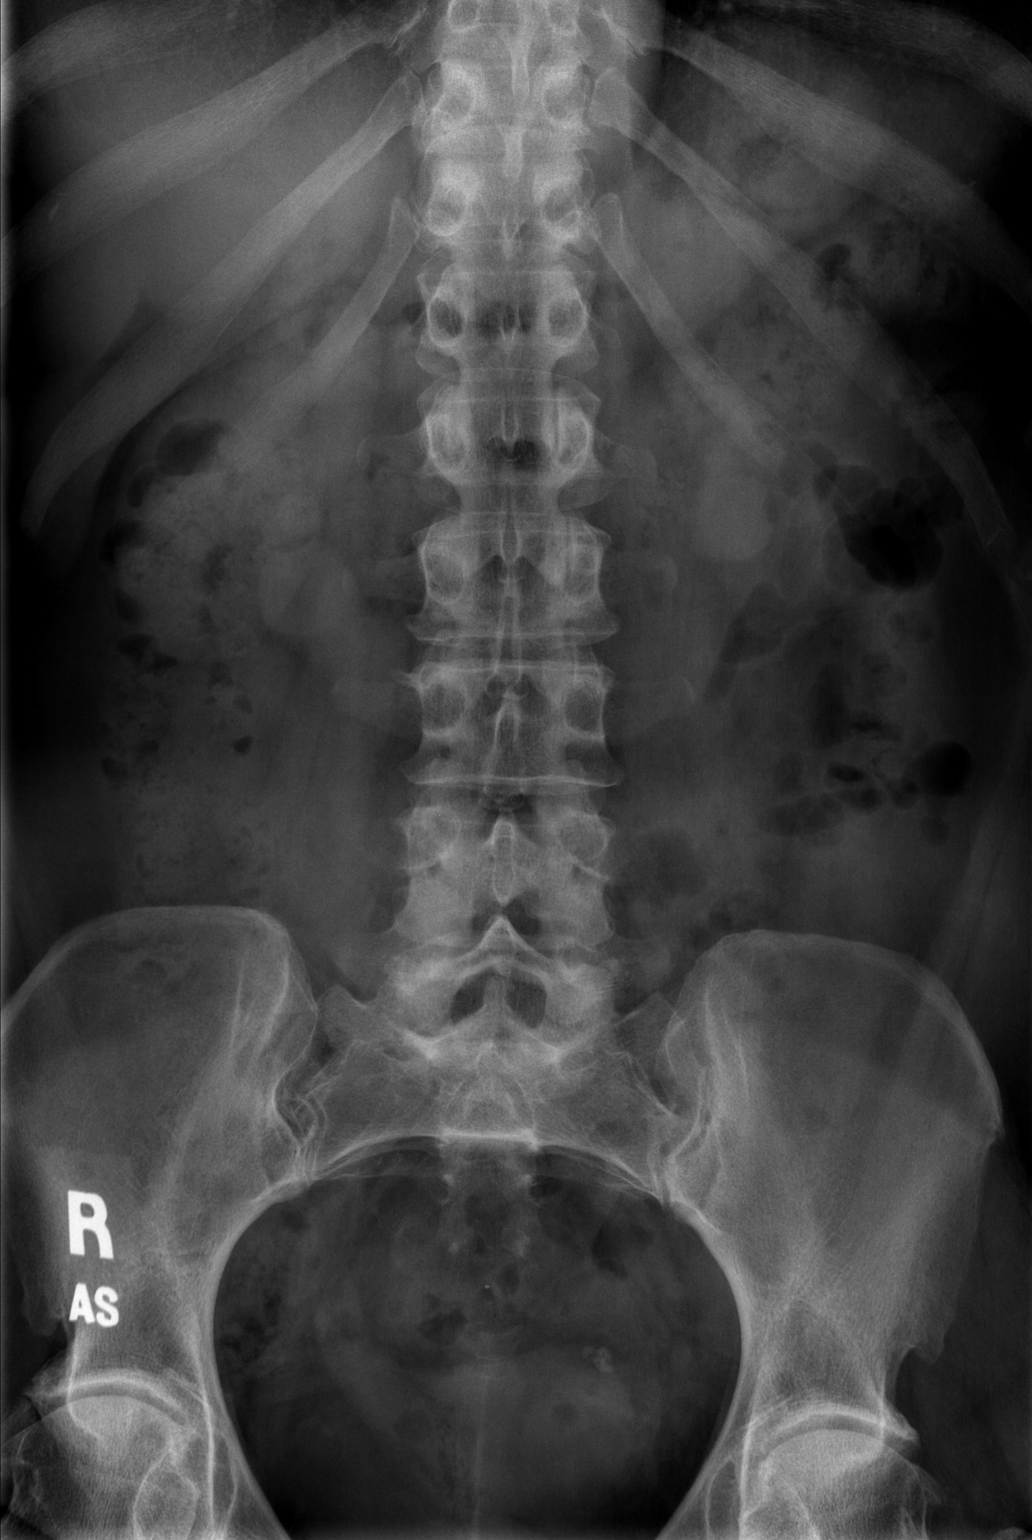

[t l-spine lat *]
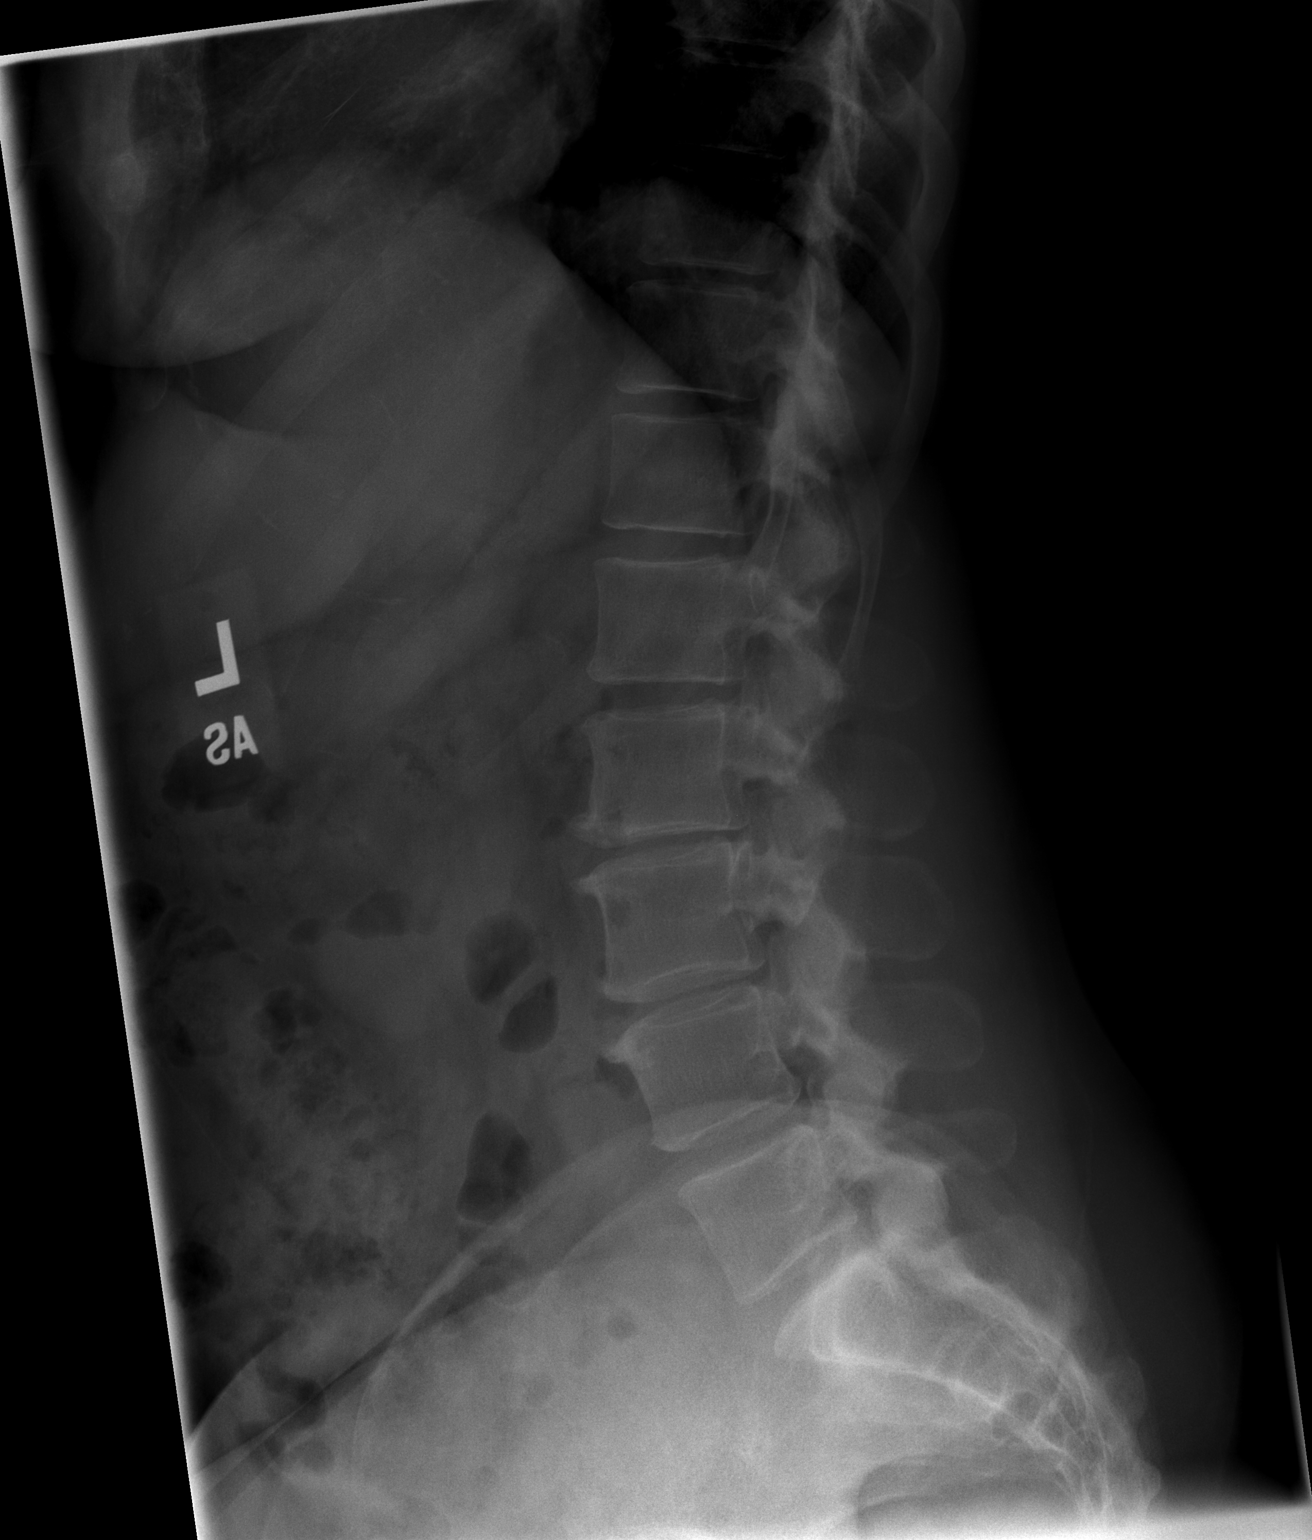

[t l-spine l5-s1 spot]
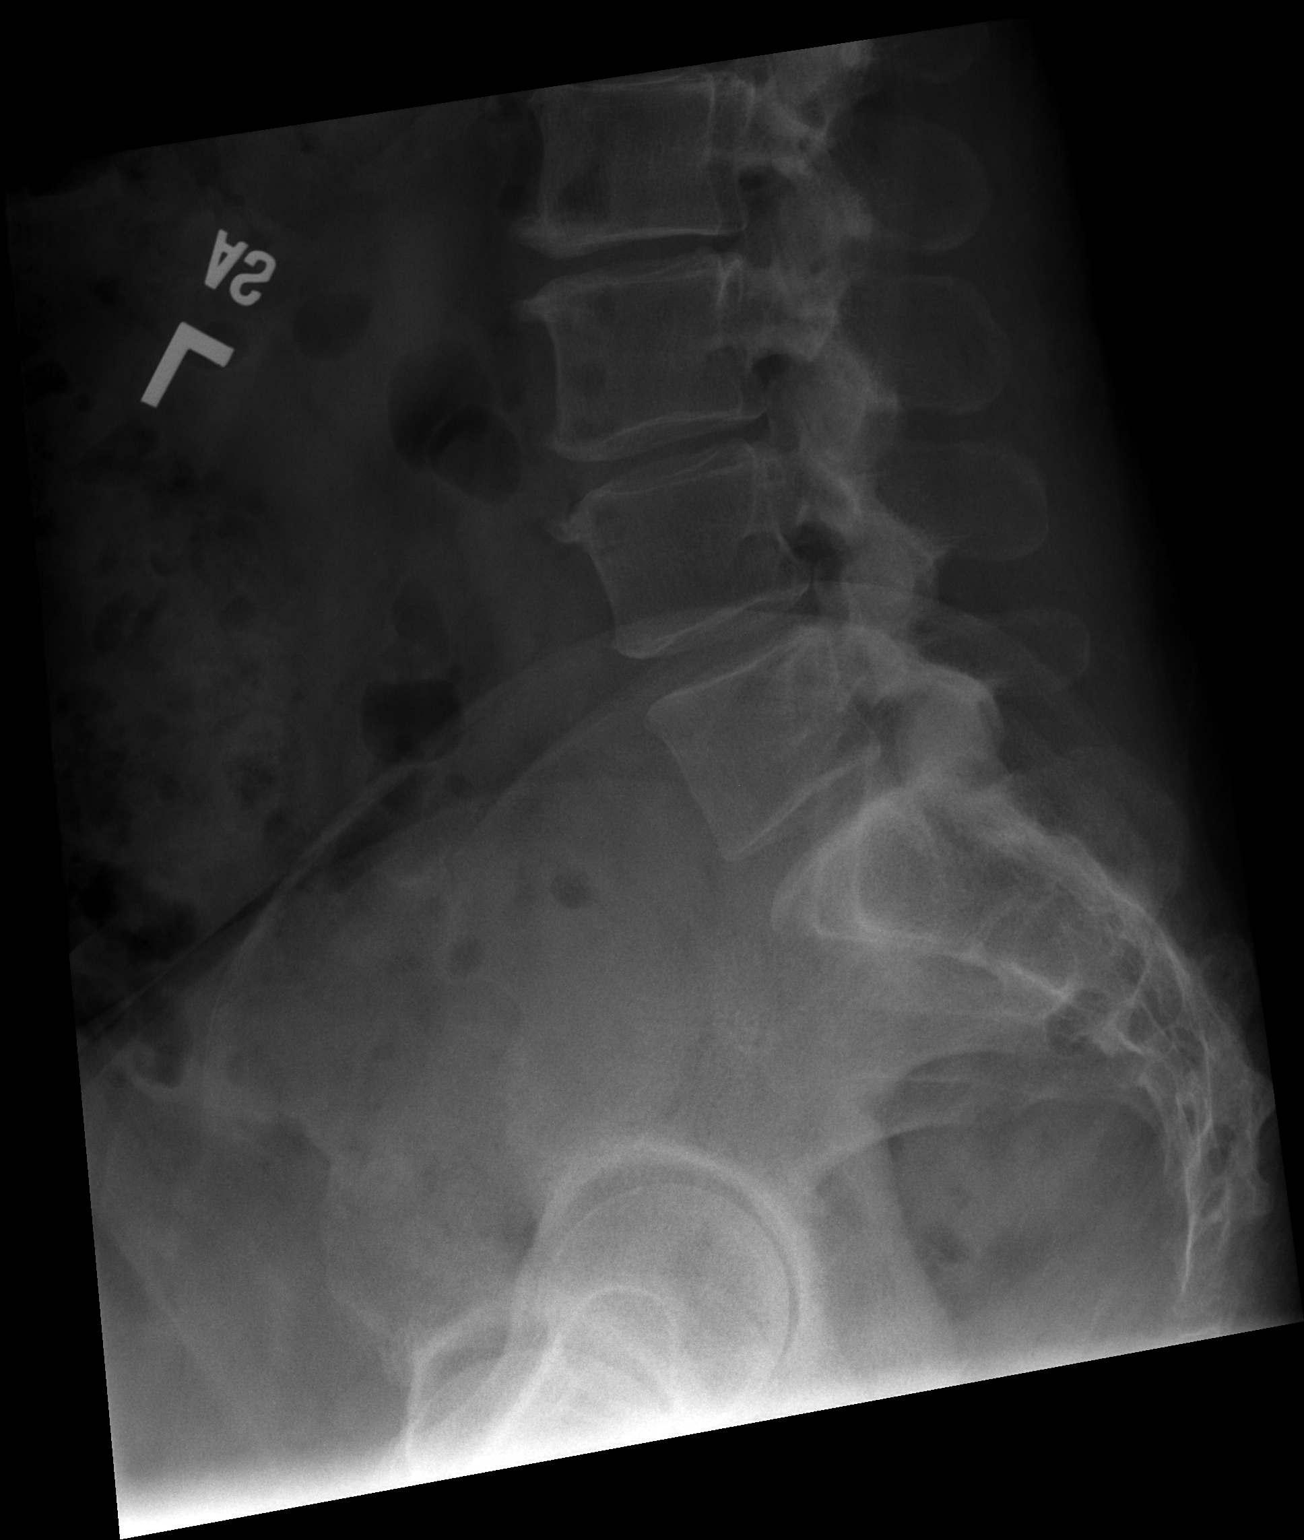

[3 of 3 positions shown; findings below may reference images not displayed]

FINDINGS: Normal lumbar alignment.  Negative for fracture or mass

Moderate disc degeneration L2-3. Mild disc degeneration L3-4, mild
disc degeneration L3-4 L4-5. No pars defect or bony lesion.
IMPRESSION: Lumbar disc degeneration.  No acute skeletal abnormality.

## 2020-06-27 IMAGING — CR DG FOOT COMPLETE 3+V*L*
1 series · 1 of 1 positions shown · non-contrast
Comparison: None.

CLINICAL DATA: Lateral left foot pain after injury

EXAM:
LEFT FOOT - COMPLETE 3+ VIEW

[t foot oblique left]
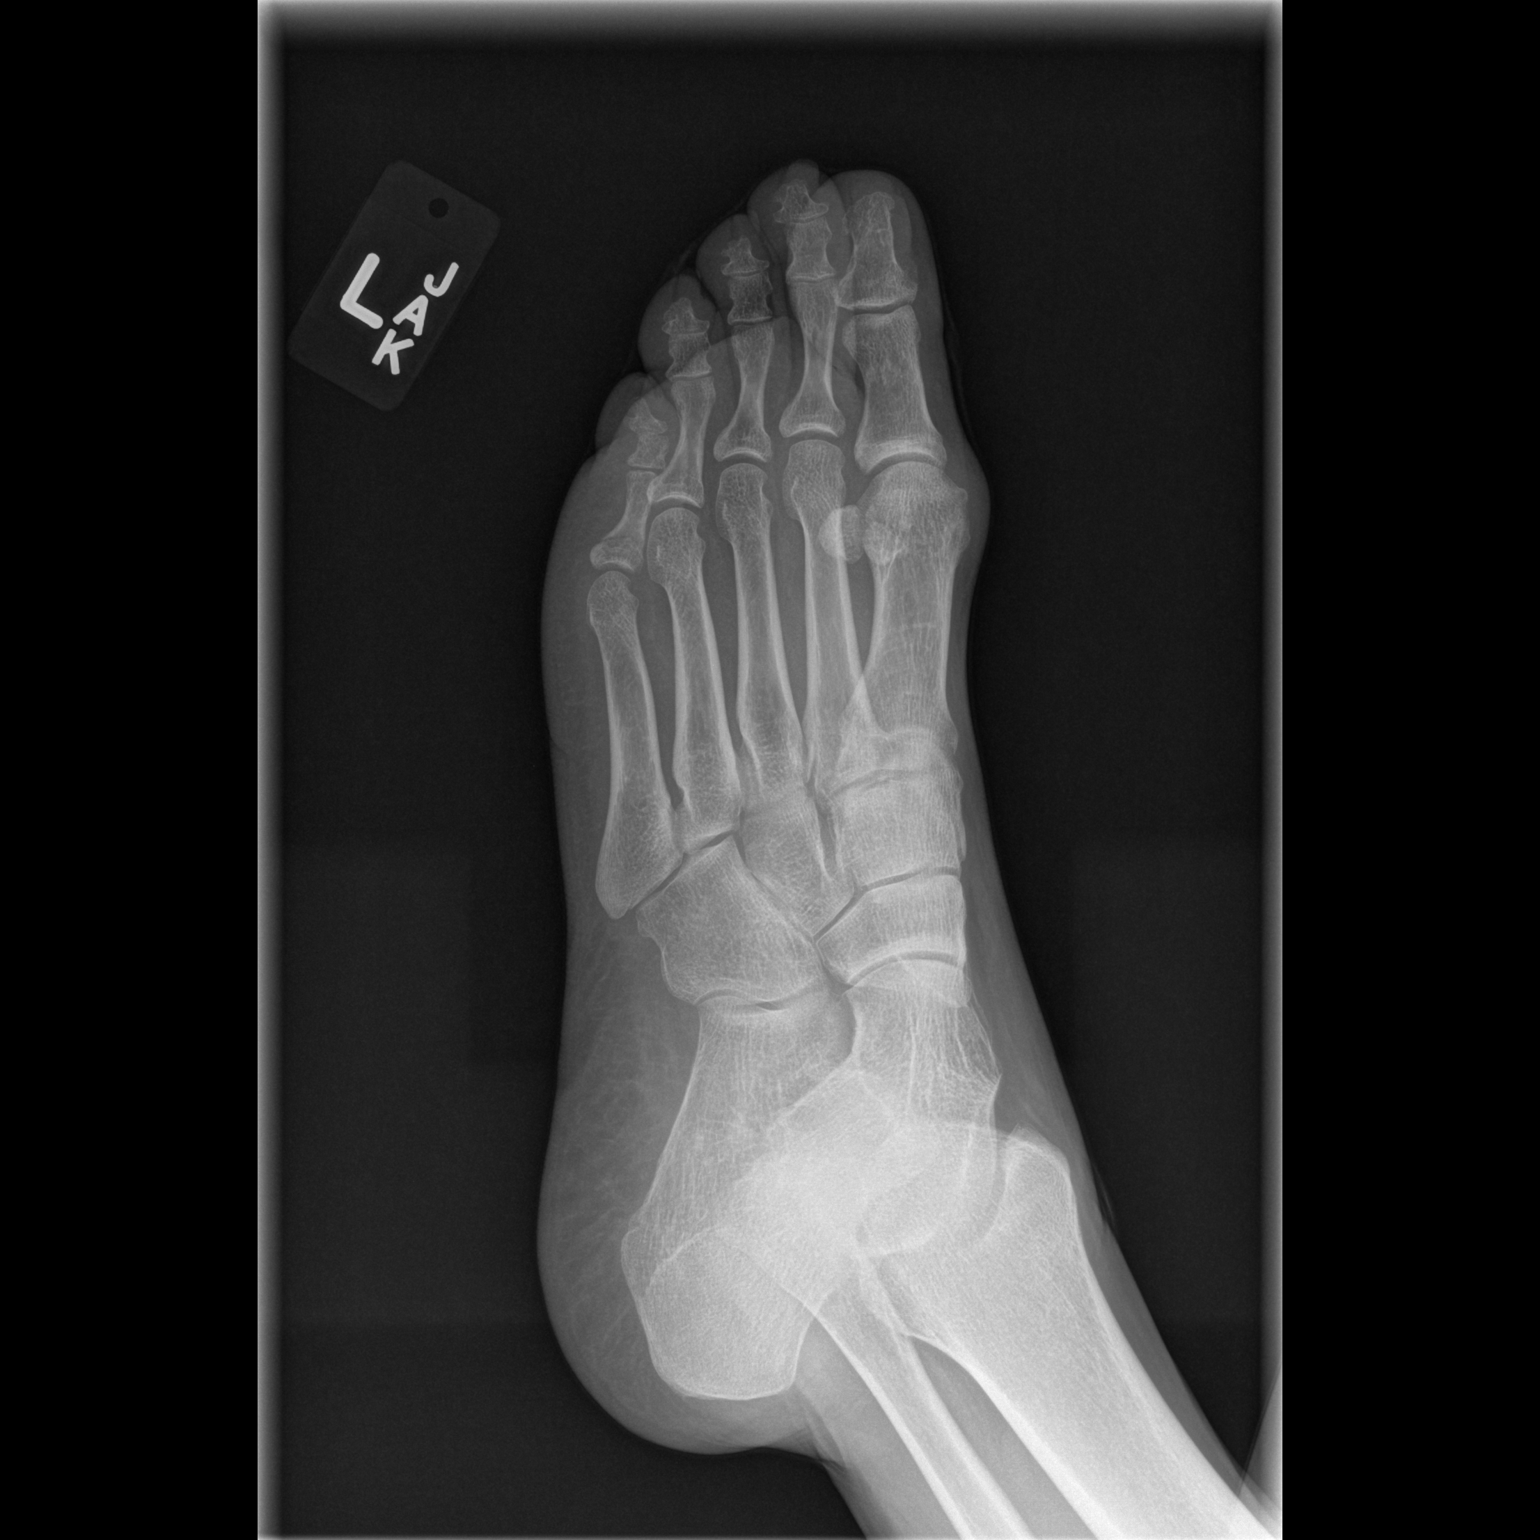

[1 of 1 positions shown; findings below may reference images not displayed]

FINDINGS: Nondisplaced fracture of the base of the proximal phalanx in the
left fifth toe, which appears non articular, with overlying soft
tissue swelling. No additional fractures. No dislocation. No
suspicious focal osseous lesions. No significant arthropathy. No
radiopaque foreign body.
IMPRESSION: Nondisplaced base of proximal phalanx fracture in the left fifth toe
with surrounding soft tissue swelling.

## 2020-08-15 ENCOUNTER — Other Ambulatory Visit (HOSPITAL_COMMUNITY): Payer: Self-pay | Admitting: Internal Medicine

## 2020-08-15 MED FILL — AMLODIPINE BESYLATE 5 MG TA: 5 | 30 days supply | Qty: 30 | Fill #0

## 2020-08-24 ENCOUNTER — Other Ambulatory Visit (HOSPITAL_COMMUNITY): Payer: Self-pay | Admitting: Obstetrics and Gynecology

## 2020-09-09 ENCOUNTER — Other Ambulatory Visit (HOSPITAL_COMMUNITY): Payer: Self-pay | Admitting: Internal Medicine

## 2020-09-10 ENCOUNTER — Other Ambulatory Visit (HOSPITAL_COMMUNITY): Payer: Self-pay

## 2020-09-10 MED FILL — Prednisone Tab 20 MG: ORAL | 7 days supply | Qty: 14 | Fill #0 | Status: CN

## 2020-09-11 ENCOUNTER — Other Ambulatory Visit (HOSPITAL_COMMUNITY): Payer: Self-pay

## 2020-09-12 ENCOUNTER — Other Ambulatory Visit (HOSPITAL_COMMUNITY): Payer: Self-pay

## 2020-09-16 ENCOUNTER — Other Ambulatory Visit (HOSPITAL_COMMUNITY): Payer: Self-pay

## 2020-09-23 ENCOUNTER — Other Ambulatory Visit (HOSPITAL_COMMUNITY): Payer: Self-pay

## 2020-10-31 ENCOUNTER — Other Ambulatory Visit (HOSPITAL_COMMUNITY): Payer: Self-pay

## 2020-10-31 MED ORDER — FOLIC ACID 1 MG PO TABS
1.0000 mg | ORAL_TABLET | Freq: Every day | ORAL | 0 refills | Status: AC
  Filled 2020-10-31: qty 30, 30d supply, fill #0
  Filled 2020-12-07: qty 30, 30d supply, fill #1

## 2020-10-31 MED ORDER — AMLODIPINE BESYLATE 5 MG PO TABS
5.0000 mg | ORAL_TABLET | Freq: Every day | ORAL | 0 refills | Status: DC
  Filled 2020-10-31: qty 30, 30d supply, fill #0
  Filled 2020-12-07: qty 30, 30d supply, fill #1

## 2020-11-02 ENCOUNTER — Other Ambulatory Visit (HOSPITAL_COMMUNITY): Payer: Self-pay

## 2020-11-02 MED ORDER — PANTOPRAZOLE SODIUM 20 MG PO TBEC
DELAYED_RELEASE_TABLET | ORAL | 0 refills | Status: AC
  Filled 2020-11-02: qty 30, 30d supply, fill #0
  Filled 2020-12-07: qty 30, 30d supply, fill #1

## 2020-11-02 MED ORDER — HYDROCHLOROTHIAZIDE 25 MG PO TABS
ORAL_TABLET | ORAL | 0 refills | Status: AC
  Filled 2020-11-02: qty 30, 30d supply, fill #0
  Filled 2020-12-07: qty 30, 30d supply, fill #1

## 2020-11-08 ENCOUNTER — Other Ambulatory Visit (HOSPITAL_COMMUNITY): Payer: Self-pay

## 2020-11-08 MED ORDER — MELOXICAM 15 MG PO TABS
15.0000 mg | ORAL_TABLET | Freq: Every day | ORAL | 1 refills | Status: AC
  Filled 2020-11-08: qty 30, 30d supply, fill #0
  Filled 2020-12-07: qty 30, 30d supply, fill #1

## 2020-12-07 ENCOUNTER — Other Ambulatory Visit (HOSPITAL_COMMUNITY): Payer: Self-pay

## 2020-12-13 ENCOUNTER — Other Ambulatory Visit (HOSPITAL_COMMUNITY): Payer: Self-pay

## 2020-12-30 ENCOUNTER — Other Ambulatory Visit (HOSPITAL_COMMUNITY): Payer: Self-pay

## 2020-12-30 MED ORDER — CELECOXIB 200 MG PO CAPS
200.0000 mg | ORAL_CAPSULE | Freq: Every day | ORAL | 1 refills | Status: AC
  Filled 2020-12-30: qty 30, 30d supply, fill #0

## 2021-01-07 ENCOUNTER — Other Ambulatory Visit (HOSPITAL_COMMUNITY): Payer: Self-pay

## 2021-01-10 ENCOUNTER — Other Ambulatory Visit (HOSPITAL_COMMUNITY): Payer: Self-pay

## 2021-01-11 ENCOUNTER — Other Ambulatory Visit (HOSPITAL_COMMUNITY): Payer: Self-pay

## 2021-01-23 ENCOUNTER — Other Ambulatory Visit (HOSPITAL_COMMUNITY): Payer: Self-pay

## 2021-01-23 MED ORDER — METHYLPREDNISOLONE 4 MG PO TBPK
ORAL_TABLET | ORAL | 0 refills | Status: AC
  Filled 2021-01-23: qty 21, 6d supply, fill #0

## 2021-01-24 ENCOUNTER — Other Ambulatory Visit (HOSPITAL_COMMUNITY): Payer: Self-pay

## 2021-02-10 ENCOUNTER — Other Ambulatory Visit (HOSPITAL_BASED_OUTPATIENT_CLINIC_OR_DEPARTMENT_OTHER): Payer: Self-pay

## 2021-02-24 ENCOUNTER — Other Ambulatory Visit (HOSPITAL_COMMUNITY): Payer: Self-pay

## 2021-02-24 MED ORDER — PREDNISONE 10 MG (21) PO TBPK
ORAL_TABLET | ORAL | 0 refills | Status: AC
  Filled 2021-02-24: qty 21, 6d supply, fill #0

## 2021-02-28 ENCOUNTER — Other Ambulatory Visit (HOSPITAL_COMMUNITY): Payer: Self-pay

## 2021-03-06 ENCOUNTER — Other Ambulatory Visit (HOSPITAL_COMMUNITY): Payer: Self-pay

## 2021-03-07 ENCOUNTER — Other Ambulatory Visit (HOSPITAL_COMMUNITY): Payer: Self-pay

## 2021-03-10 ENCOUNTER — Other Ambulatory Visit (HOSPITAL_COMMUNITY): Payer: Self-pay

## 2021-03-13 ENCOUNTER — Other Ambulatory Visit (HOSPITAL_COMMUNITY): Payer: Self-pay

## 2021-03-14 ENCOUNTER — Other Ambulatory Visit (HOSPITAL_COMMUNITY): Payer: Self-pay

## 2021-03-14 MED ORDER — PANTOPRAZOLE SODIUM 20 MG PO TBEC
DELAYED_RELEASE_TABLET | ORAL | 0 refills | Status: DC
  Filled 2021-03-14 – 2021-03-20 (×2): qty 90, 90d supply, fill #0
  Filled 2021-03-20: qty 30, 30d supply, fill #0

## 2021-03-20 ENCOUNTER — Other Ambulatory Visit (HOSPITAL_COMMUNITY): Payer: Self-pay

## 2021-03-21 ENCOUNTER — Other Ambulatory Visit (HOSPITAL_COMMUNITY): Payer: Self-pay

## 2021-03-23 ENCOUNTER — Other Ambulatory Visit (HOSPITAL_COMMUNITY): Payer: Self-pay

## 2021-03-24 ENCOUNTER — Other Ambulatory Visit (HOSPITAL_COMMUNITY): Payer: Self-pay

## 2021-03-24 MED ORDER — CELECOXIB 100 MG PO CAPS
ORAL_CAPSULE | ORAL | 3 refills | Status: AC
  Filled 2021-03-24: qty 60, 30d supply, fill #0

## 2021-03-27 ENCOUNTER — Other Ambulatory Visit (HOSPITAL_COMMUNITY): Payer: Self-pay

## 2021-03-28 ENCOUNTER — Other Ambulatory Visit (HOSPITAL_COMMUNITY): Payer: Self-pay

## 2021-04-17 ENCOUNTER — Other Ambulatory Visit (HOSPITAL_COMMUNITY): Payer: Self-pay

## 2021-04-17 MED ORDER — METHYLPREDNISOLONE 4 MG PO TBPK
ORAL_TABLET | ORAL | 0 refills | Status: AC
  Filled 2021-04-17: qty 21, 6d supply, fill #0

## 2021-04-17 MED ORDER — BACLOFEN 10 MG PO TABS
ORAL_TABLET | ORAL | 0 refills | Status: AC
  Filled 2021-04-17: qty 20, 7d supply, fill #0

## 2021-04-18 ENCOUNTER — Other Ambulatory Visit (HOSPITAL_COMMUNITY): Payer: Self-pay

## 2021-04-19 ENCOUNTER — Other Ambulatory Visit (HOSPITAL_COMMUNITY): Payer: Self-pay

## 2021-04-19 MED ORDER — HYDROCHLOROTHIAZIDE 25 MG PO TABS
25.0000 mg | ORAL_TABLET | Freq: Every day | ORAL | 3 refills | Status: AC
  Filled 2021-04-19 – 2021-05-09 (×2): qty 30, 30d supply, fill #0
  Filled 2021-07-11: qty 30, 30d supply, fill #1

## 2021-04-19 MED ORDER — AMLODIPINE BESYLATE 5 MG PO TABS
5.0000 mg | ORAL_TABLET | Freq: Every day | ORAL | 3 refills | Status: AC
  Filled 2021-04-19 – 2021-05-09 (×2): qty 30, 30d supply, fill #0
  Filled 2021-07-11: qty 30, 30d supply, fill #1

## 2021-04-21 ENCOUNTER — Other Ambulatory Visit (HOSPITAL_COMMUNITY): Payer: Self-pay

## 2021-04-22 ENCOUNTER — Other Ambulatory Visit (HOSPITAL_COMMUNITY): Payer: Self-pay

## 2021-04-24 ENCOUNTER — Other Ambulatory Visit (HOSPITAL_COMMUNITY): Payer: Self-pay

## 2021-04-27 ENCOUNTER — Other Ambulatory Visit (HOSPITAL_COMMUNITY): Payer: Self-pay

## 2021-04-28 ENCOUNTER — Other Ambulatory Visit (HOSPITAL_COMMUNITY): Payer: Self-pay

## 2021-05-03 ENCOUNTER — Other Ambulatory Visit (HOSPITAL_COMMUNITY): Payer: Self-pay

## 2021-05-09 ENCOUNTER — Other Ambulatory Visit (HOSPITAL_COMMUNITY): Payer: Self-pay

## 2021-05-09 MED FILL — Prednisone Tab 20 MG: ORAL | 7 days supply | Qty: 14 | Fill #0 | Status: AC

## 2021-05-09 MED FILL — Albuterol Sulfate Inhal Aero 108 MCG/ACT (90MCG Base Equiv): RESPIRATORY_TRACT | 30 days supply | Qty: 8.5 | Fill #0 | Status: AC

## 2021-05-11 ENCOUNTER — Other Ambulatory Visit (HOSPITAL_COMMUNITY): Payer: Self-pay

## 2021-05-12 ENCOUNTER — Other Ambulatory Visit (HOSPITAL_COMMUNITY): Payer: Self-pay

## 2021-07-11 ENCOUNTER — Other Ambulatory Visit (HOSPITAL_COMMUNITY): Payer: Self-pay

## 2021-07-11 MED ORDER — PANTOPRAZOLE SODIUM 20 MG PO TBEC
20.0000 mg | DELAYED_RELEASE_TABLET | Freq: Every day | ORAL | 3 refills | Status: AC
  Filled 2021-07-11: qty 90, 90d supply, fill #0

## 2021-07-11 MED FILL — Albuterol Sulfate Inhal Aero 108 MCG/ACT (90MCG Base Equiv): RESPIRATORY_TRACT | 20 days supply | Qty: 18 | Fill #1 | Status: CN

## 2021-07-19 ENCOUNTER — Other Ambulatory Visit (HOSPITAL_COMMUNITY): Payer: Self-pay

## 2021-07-21 ENCOUNTER — Other Ambulatory Visit (HOSPITAL_COMMUNITY): Payer: Self-pay

## 2021-08-04 ENCOUNTER — Other Ambulatory Visit (HOSPITAL_COMMUNITY): Payer: Self-pay

## 2021-08-28 ENCOUNTER — Ambulatory Visit: Payer: 59 | Admitting: Dermatology

## 2021-10-25 ENCOUNTER — Ambulatory Visit (INDEPENDENT_AMBULATORY_CARE_PROVIDER_SITE_OTHER): Payer: 59 | Admitting: Dermatology

## 2021-10-25 DIAGNOSIS — D485 Neoplasm of uncertain behavior of skin: Secondary | ICD-10-CM | POA: Diagnosis not present

## 2021-10-25 DIAGNOSIS — L700 Acne vulgaris: Secondary | ICD-10-CM

## 2021-10-25 DIAGNOSIS — L821 Other seborrheic keratosis: Secondary | ICD-10-CM

## 2021-10-25 DIAGNOSIS — D492 Neoplasm of unspecified behavior of bone, soft tissue, and skin: Secondary | ICD-10-CM

## 2021-10-25 MED ORDER — TRETINOIN 0.025 % EX CREA: TOPICAL_CREAM | Freq: Every day | CUTANEOUS | 4 refills | Status: AC

## 2021-10-25 NOTE — Patient Instructions (Addendum)
Wound Care Instructions ? ?Cleanse wound gently with soap and water once a day then pat dry with clean gauze. Apply a thing coat of Petrolatum (petroleum jelly, "Vaseline") over the wound (unless you have an allergy to this). We recommend that you use a new, sterile tube of Vaseline. Do not pick or remove scabs. Do not remove the yellow or white "healing tissue" from the base of the wound. ? ?Cover the wound with fresh, clean, nonstick gauze and secure with paper tape. You may use Band-Aids in place of gauze and tape if the would is small enough, but would recommend trimming much of the tape off as there is often too much. Sometimes Band-Aids can irritate the skin. ? ?You should call the office for your biopsy report after 1 week if you have not already been contacted. ? ?If you experience any problems, such as abnormal amounts of bleeding, swelling, significant bruising, significant pain, or evidence of infection, please call the office immediately. ? ?FOR ADULT SURGERY PATIENTS: If you need something for pain relief you may take 1 extra strength Tylenol (acetaminophen) AND 2 Ibuprofen ('200mg'$  each) together every 4 hours as needed for pain. (do not take these if you are allergic to them or if you have a reason you should not take them.) Typically, you may only need pain medication for 1 to 3 days.  ? ? ?Topical retinoid medications like tretinoin/Retin-A, adapalene/Differin, tazarotene/Fabior, and Epiduo/Epiduo Forte can cause dryness and irritation when first started. Only apply a pea-sized amount to the entire affected area. Avoid applying it around the eyes, edges of mouth and creases at the nose. If you experience irritation, use a good moisturizer first and/or apply the medicine less often. If you are doing well with the medicine, you can increase how often you use it until you are applying every night. Be careful with sun protection while using this medication as it can make you sensitive to the sun. This  medicine should not be used by pregnant women.   ? ? ? ? ? ?If You Need Anything After Your Visit ? ?If you have any questions or concerns for your doctor, please call our main line at [phone] and press option 4 to reach your doctor's medical assistant. If no one answers, please leave a voicemail as directed and we will return your call as soon as possible. Messages left after 4 pm will be answered the following business day.  ? ?You may also send [place] a message via MyChart. We typically respond to MyChart messages within 1-2 business days. ? ?For prescription refills, please ask your pharmacy to contact our office. Our fax number is [phone]. ? ?If you have an urgent issue when the clinic is closed that cannot wait until the next business day, you can page your doctor at the number below.   ? ?Please note that while we do our best to be available for urgent issues outside of office hours, we are not available 24/7.  ? ?If you have an urgent issue and are unable to reach [place], you may choose to seek medical care at your doctor's office, retail clinic, urgent care center, or emergency room. ? ?If you have a medical emergency, please immediately call 911 or go to the emergency department. ? ?Pager Numbers ? ?- Dr. Nehemiah Massed: [phone] ? ?- Dr. Laurence Ferrari: [phone] ? ?- Dr. Nicole Kindred: [phone] ? ?In the event of inclement weather, please call our main line at [phone] for an update on the status of  any delays or closures. ? ?Dermatology Medication Tips: ?Please keep the boxes that topical medications come in in order to help keep track of the instructions about where and how to use these. Pharmacies typically print the medication instructions only on the boxes and not directly on the medication tubes.  ? ?If your medication is too expensive, please contact our office at [phone] option 4 or send [place] a message through [place].  ? ?We are unable to tell what your co-pay for medications will be in advance as  this is different depending on your insurance coverage. However, we may be able to find a substitute medication at lower cost or fill out paperwork to get insurance to cover a needed medication.  ? ?If a prior authorization is required to get your medication covered by your insurance company, please allow [place] 1-2 business days to complete this process. ? ?Drug prices often vary depending on where the prescription is filled and some pharmacies may offer cheaper prices. ? ?The website www.goodrx.com contains coupons for medications through different pharmacies. The prices here do not account for what the cost may be with help from insurance (it may be cheaper with your insurance), but the website can give you the price if you did not use any insurance.  ?- You can print the associated coupon and take it with your prescription to the pharmacy.  ?- You may also stop by our office during regular business hours and pick up a GoodRx coupon card.  ?- If you need your prescription sent electronically to a different pharmacy, notify our office through [facility] or by phone at [phone] option 4. ? ? ? ? ?Si Usted Necesita Algo Despu?s de Su Visita ? ?Tambi?n puede enviarnos un mensaje a trav?s de MyChart. Por lo general respondemos a los mensajes de MyChart en el transcurso de 1 a 2 d?as h?biles. ? ?Para renovar recetas, por favor pida a su farmacia que se ponga en contacto con nuestra oficina. Nuestro n?mero de fax es el [phone]. ? ?Si tiene un asunto urgente cuando la cl?nica est? cerrada y que no puede esperar hasta el siguiente d?a h?bil, puede llamar/localizar a su doctor(a) al n?mero que aparece a continuaci?n.  ? ?Por favor, tenga en cuenta que aunque hacemos todo lo posible para estar disponibles para asuntos urgentes fuera del horario de oficina, no estamos disponibles las 24 horas del d?a, los 7 d?as de la semana.  ? ?Si tiene un problema urgente y no puede comunicarse con nosotros, puede optar por  buscar atenci?n m?dica  en el consultorio de su doctor(a), en una cl?nica privada, en un centro de atenci?n urgente o en una sala de emergencias. ? ?Si tiene Engineer, maintenance (IT) m?dica, por favor llame inmediatamente al 911 o vaya a la sala de emergencias. ? ?N?meros de b?per ? ?- Dr. Nehemiah Massed: [phone] ? ?- Dra. Moye: [phone] ? ?- Dra. Nicole Kindred: [phone] ? ?En caso de inclemencias del tiempo, por favor llame a nuestra l?nea principal al [phone] para una actualizaci?n sobre el estado de cualquier retraso o cierre. ? ?Consejos para la medicaci?n en dermatolog?a: ?Por favor, guarde las cajas en las que vienen los medicamentos de uso t?pico para ayudarle a seguir las instrucciones sobre d?nde y c?mo usarlos. Las farmacias generalmente imprimen las instrucciones del medicamento s?lo en las cajas y no directamente en los tubos del [place].  ? ?Si su medicamento es muy caro, por favor, p?ngase en contacto con Zigmund Daniel llamando al [phone] y presione la  opci?n 4 o env?enos un mensaje a trav?s de MyChart.  ? ?No podemos decirle cu?l ser? su copago por los medicamentos por adelantado ya que esto es diferente dependiendo de la cobertura de su seguro. Sin embargo, es posible que podamos encontrar un medicamento sustituto a Electrical engineer un formulario para que el seguro cubra el medicamento que se considera necesario.  ? ?Si se requiere Ardelia Mems autorizaci?n previa para que su compa??a de seguros [place] su medicamento, por favor perm?tanos de 1 a 2 d?as h?biles para completar este proceso. ? ?Los precios de los medicamentos var?an con frecuencia dependiendo del Environmental consultant de d?nde se surte la receta y alguna farmacias pueden ofrecer precios m?s baratos. ? ?El sitio web www.goodrx.com tiene cupones para medicamentos de Airline pilot. Los precios aqu? no tienen en cuenta lo que podr?a costar con la ayuda del seguro (puede ser m?s barato con su seguro), pero el sitio web puede darle el precio si no  utiliz? ning?n seguro.  ?- Puede imprimir el cup?n correspondiente y llevarlo con su receta a la farmacia.  ?- Tambi?n puede pasar por nuestra oficina durante el horario de atenci?n regular y Corporate treasurer

## 2021-10-25 NOTE — Progress Notes (Signed)
New Patient Visit  Subjective  Brianna Jenkins is a 61 y.o. female who presents for the following: Acne (Face, otc treatment Cetaphil cleanser and moisturizer, witch hazel, ), skin tags (Face, > 1 yr, irritating), and Nevus (L post neck/scalp 10 yrs, shave removal in past and came back). The patient has spots, moles and lesions to be evaluated, some may be new or changing and the patient has concerns that these could be cancer.  The following portions of the chart were reviewed this encounter and updated as appropriate:   Tobacco  Allergies  Meds  Problems  Med Hx  Surg Hx  Fam Hx     Review of Systems:  No other skin or systemic complaints except as noted in HPI or Assessment and Plan.  Objective  Well appearing patient in no apparent distress; mood and affect are within normal limits.  A focused examination was performed including face, neck. Relevant physical exam findings are noted in the Assessment and Plan.  face Stuck-on, waxy, tan-brown papules and plaques -- Discussed benign etiology and prognosis.   face Comedones face  L lat neck Verrucous pap 1.3cm      Assessment & Plan  Seborrheic keratosis face  Benign,  Discussed cosmetic procedure, noncovered.  $60 for 1st lesion and $15 for each additional lesion if done on the same day.  Maximum charge $350.  One touch-up treatment included no charge. Discussed risks of treatment including dyspigmentation, small scar, and/or recurrence. Recommend daily broad spectrum sunscreen SPF 30+/photoprotection to treated areas once healed.   Acne vulgaris face  Start Tretinoin 0.025 qhs to face for acne  Topical retinoid medications like tretinoin/Retin-A, adapalene/Differin, tazarotene/Fabior, and Epiduo/Epiduo Forte can cause dryness and irritation when first started. Only apply a pea-sized amount to the entire affected area. Avoid applying it around the eyes, edges of mouth and creases at the nose. If you experience  irritation, use a good moisturizer first and/or apply the medicine less often. If you are doing well with the medicine, you can increase how often you use it until you are applying every night. Be careful with sun protection while using this medication as it can make you sensitive to the sun. This medicine should not be used by pregnant women.    tretinoin (RETIN-A) 0.025 % cream - face Apply topically at bedtime. Pea size amount Qhs as tolerated to face for acne  Neoplasm of skin L lat neck  Epidermal / dermal shaving  Lesion diameter (cm):  1.3 Informed consent: discussed and consent obtained   Timeout: patient name, date of birth, surgical site, and procedure verified   Procedure prep:  Patient was prepped and draped in usual sterile fashion Prep type:  Isopropyl alcohol Anesthesia: the lesion was anesthetized in a standard fashion   Anesthetic:  1% lidocaine w/ epinephrine 1-100,000 buffered w/ 8.4% NaHCO3 Instrument used: flexible razor blade   Hemostasis achieved with: pressure, aluminum chloride and electrodesiccation   Outcome: patient tolerated procedure well   Post-procedure details: sterile dressing applied and wound care instructions given   Dressing type: bandage and petrolatum    Specimen 1 - Surgical pathology Differential Diagnosis: D48.5 ISK vs other  Check Margins: No Verrucous pap 1.3cm   Return in about 4 months (around 02/25/2022) for Acne f/u, SK f/u.  I, Othelia Pulling, RMA, am acting as scribe for Sarina Ser, MD . Documentation: I have reviewed the above documentation for accuracy and completeness, and I agree with the above.  Sarina Ser, MD

## 2021-10-27 DIAGNOSIS — Z0000 Encounter for general adult medical examination without abnormal findings: Secondary | ICD-10-CM | POA: Diagnosis not present

## 2021-10-27 DIAGNOSIS — I10 Essential (primary) hypertension: Secondary | ICD-10-CM | POA: Diagnosis not present

## 2021-10-30 DIAGNOSIS — Z1231 Encounter for screening mammogram for malignant neoplasm of breast: Secondary | ICD-10-CM | POA: Diagnosis not present

## 2021-10-30 DIAGNOSIS — Z01411 Encounter for gynecological examination (general) (routine) with abnormal findings: Secondary | ICD-10-CM | POA: Diagnosis not present

## 2021-10-30 DIAGNOSIS — R69 Illness, unspecified: Secondary | ICD-10-CM | POA: Diagnosis not present

## 2021-10-30 DIAGNOSIS — Z6826 Body mass index (BMI) 26.0-26.9, adult: Secondary | ICD-10-CM | POA: Diagnosis not present

## 2021-10-30 DIAGNOSIS — Z0142 Encounter for cervical smear to confirm findings of recent normal smear following initial abnormal smear: Secondary | ICD-10-CM | POA: Diagnosis not present

## 2021-10-30 DIAGNOSIS — Z124 Encounter for screening for malignant neoplasm of cervix: Secondary | ICD-10-CM | POA: Diagnosis not present

## 2021-10-30 DIAGNOSIS — Z01419 Encounter for gynecological examination (general) (routine) without abnormal findings: Secondary | ICD-10-CM | POA: Diagnosis not present

## 2021-10-31 ENCOUNTER — Telehealth: Payer: Self-pay

## 2021-10-31 NOTE — Telephone Encounter (Signed)
Patient informed of pathology results 

## 2021-10-31 NOTE — Telephone Encounter (Signed)
-----   Message from Ralene Bathe, MD sent at 10/27/2021  6:35 PM EDT ----- Diagnosis Skin , left lat neck PIGMENTED SEBORRHEIC KERATOSIS  Benign keratosis May recur No further treatment needed

## 2021-11-04 ENCOUNTER — Encounter: Payer: Self-pay | Admitting: Dermatology

## 2021-11-13 DIAGNOSIS — M1611 Unilateral primary osteoarthritis, right hip: Secondary | ICD-10-CM | POA: Diagnosis not present

## 2021-12-01 DIAGNOSIS — N950 Postmenopausal bleeding: Secondary | ICD-10-CM | POA: Diagnosis not present

## 2021-12-18 DIAGNOSIS — N950 Postmenopausal bleeding: Secondary | ICD-10-CM | POA: Diagnosis not present

## 2022-01-08 DIAGNOSIS — M160 Bilateral primary osteoarthritis of hip: Secondary | ICD-10-CM | POA: Diagnosis not present

## 2022-03-07 ENCOUNTER — Ambulatory Visit: Payer: 59 | Admitting: Dermatology

## 2022-06-01 DIAGNOSIS — Z791 Long term (current) use of non-steroidal anti-inflammatories (NSAID): Secondary | ICD-10-CM | POA: Diagnosis not present

## 2022-06-01 DIAGNOSIS — Z7951 Long term (current) use of inhaled steroids: Secondary | ICD-10-CM | POA: Diagnosis not present

## 2022-06-01 DIAGNOSIS — J45909 Unspecified asthma, uncomplicated: Secondary | ICD-10-CM | POA: Diagnosis not present

## 2022-06-01 DIAGNOSIS — Z8249 Family history of ischemic heart disease and other diseases of the circulatory system: Secondary | ICD-10-CM | POA: Diagnosis not present

## 2022-06-01 DIAGNOSIS — K219 Gastro-esophageal reflux disease without esophagitis: Secondary | ICD-10-CM | POA: Diagnosis not present

## 2022-06-01 DIAGNOSIS — Z833 Family history of diabetes mellitus: Secondary | ICD-10-CM | POA: Diagnosis not present

## 2022-06-01 DIAGNOSIS — I10 Essential (primary) hypertension: Secondary | ICD-10-CM | POA: Diagnosis not present

## 2022-06-01 DIAGNOSIS — Z87891 Personal history of nicotine dependence: Secondary | ICD-10-CM | POA: Diagnosis not present

## 2022-06-01 DIAGNOSIS — M1990 Unspecified osteoarthritis, unspecified site: Secondary | ICD-10-CM | POA: Diagnosis not present

## 2022-06-29 DIAGNOSIS — Z1159 Encounter for screening for other viral diseases: Secondary | ICD-10-CM | POA: Diagnosis not present

## 2022-06-29 DIAGNOSIS — I10 Essential (primary) hypertension: Secondary | ICD-10-CM | POA: Diagnosis not present

## 2022-06-29 DIAGNOSIS — Z111 Encounter for screening for respiratory tuberculosis: Secondary | ICD-10-CM | POA: Diagnosis not present

## 2022-06-29 DIAGNOSIS — Z789 Other specified health status: Secondary | ICD-10-CM | POA: Diagnosis not present

## 2022-06-29 DIAGNOSIS — E7800 Pure hypercholesterolemia, unspecified: Secondary | ICD-10-CM | POA: Diagnosis not present

## 2022-12-17 DIAGNOSIS — M160 Bilateral primary osteoarthritis of hip: Secondary | ICD-10-CM | POA: Diagnosis not present

## 2022-12-18 DIAGNOSIS — I10 Essential (primary) hypertension: Secondary | ICD-10-CM | POA: Diagnosis not present

## 2022-12-18 DIAGNOSIS — M1990 Unspecified osteoarthritis, unspecified site: Secondary | ICD-10-CM | POA: Diagnosis not present

## 2022-12-18 DIAGNOSIS — E7800 Pure hypercholesterolemia, unspecified: Secondary | ICD-10-CM | POA: Diagnosis not present

## 2022-12-18 DIAGNOSIS — J45909 Unspecified asthma, uncomplicated: Secondary | ICD-10-CM | POA: Diagnosis not present

## 2022-12-18 DIAGNOSIS — K219 Gastro-esophageal reflux disease without esophagitis: Secondary | ICD-10-CM | POA: Diagnosis not present

## 2022-12-18 DIAGNOSIS — M545 Low back pain, unspecified: Secondary | ICD-10-CM | POA: Diagnosis not present

## 2022-12-18 DIAGNOSIS — Z0000 Encounter for general adult medical examination without abnormal findings: Secondary | ICD-10-CM | POA: Diagnosis not present

## 2022-12-21 ENCOUNTER — Other Ambulatory Visit: Payer: Self-pay | Admitting: Obstetrics and Gynecology

## 2022-12-21 DIAGNOSIS — Z124 Encounter for screening for malignant neoplasm of cervix: Secondary | ICD-10-CM | POA: Diagnosis not present

## 2022-12-21 DIAGNOSIS — Z113 Encounter for screening for infections with a predominantly sexual mode of transmission: Secondary | ICD-10-CM | POA: Diagnosis not present

## 2022-12-21 DIAGNOSIS — Z01419 Encounter for gynecological examination (general) (routine) without abnormal findings: Secondary | ICD-10-CM | POA: Diagnosis not present

## 2022-12-21 DIAGNOSIS — E2839 Other primary ovarian failure: Secondary | ICD-10-CM

## 2022-12-21 DIAGNOSIS — Z01411 Encounter for gynecological examination (general) (routine) with abnormal findings: Secondary | ICD-10-CM | POA: Diagnosis not present

## 2022-12-21 DIAGNOSIS — Z1231 Encounter for screening mammogram for malignant neoplasm of breast: Secondary | ICD-10-CM | POA: Diagnosis not present

## 2023-04-22 DIAGNOSIS — Z01812 Encounter for preprocedural laboratory examination: Secondary | ICD-10-CM | POA: Diagnosis not present

## 2023-04-22 DIAGNOSIS — M1611 Unilateral primary osteoarthritis, right hip: Secondary | ICD-10-CM | POA: Diagnosis not present

## 2023-05-16 DIAGNOSIS — M1611 Unilateral primary osteoarthritis, right hip: Secondary | ICD-10-CM | POA: Diagnosis not present

## 2023-05-20 DIAGNOSIS — M1612 Unilateral primary osteoarthritis, left hip: Secondary | ICD-10-CM | POA: Diagnosis not present

## 2023-05-26 DIAGNOSIS — M1990 Unspecified osteoarthritis, unspecified site: Secondary | ICD-10-CM | POA: Diagnosis not present

## 2023-05-26 DIAGNOSIS — I10 Essential (primary) hypertension: Secondary | ICD-10-CM | POA: Diagnosis not present

## 2023-05-26 DIAGNOSIS — K219 Gastro-esophageal reflux disease without esophagitis: Secondary | ICD-10-CM | POA: Diagnosis not present

## 2023-05-26 DIAGNOSIS — Z833 Family history of diabetes mellitus: Secondary | ICD-10-CM | POA: Diagnosis not present

## 2023-05-26 DIAGNOSIS — Z79890 Hormone replacement therapy (postmenopausal): Secondary | ICD-10-CM | POA: Diagnosis not present

## 2023-05-26 DIAGNOSIS — Z7951 Long term (current) use of inhaled steroids: Secondary | ICD-10-CM | POA: Diagnosis not present

## 2023-05-26 DIAGNOSIS — N952 Postmenopausal atrophic vaginitis: Secondary | ICD-10-CM | POA: Diagnosis not present

## 2023-05-26 DIAGNOSIS — Z5948 Other specified lack of adequate food: Secondary | ICD-10-CM | POA: Diagnosis not present

## 2023-05-26 DIAGNOSIS — J45909 Unspecified asthma, uncomplicated: Secondary | ICD-10-CM | POA: Diagnosis not present

## 2023-05-26 DIAGNOSIS — Z8249 Family history of ischemic heart disease and other diseases of the circulatory system: Secondary | ICD-10-CM | POA: Diagnosis not present

## 2023-05-26 DIAGNOSIS — Z5986 Financial insecurity: Secondary | ICD-10-CM | POA: Diagnosis not present

## 2023-05-26 DIAGNOSIS — Z96641 Presence of right artificial hip joint: Secondary | ICD-10-CM | POA: Diagnosis not present

## 2023-05-29 DIAGNOSIS — M1612 Unilateral primary osteoarthritis, left hip: Secondary | ICD-10-CM | POA: Diagnosis not present

## 2023-06-04 DIAGNOSIS — M1612 Unilateral primary osteoarthritis, left hip: Secondary | ICD-10-CM | POA: Diagnosis not present

## 2023-06-11 DIAGNOSIS — I10 Essential (primary) hypertension: Secondary | ICD-10-CM | POA: Diagnosis not present

## 2023-06-19 DIAGNOSIS — M1612 Unilateral primary osteoarthritis, left hip: Secondary | ICD-10-CM | POA: Diagnosis not present

## 2023-06-26 DIAGNOSIS — M1612 Unilateral primary osteoarthritis, left hip: Secondary | ICD-10-CM | POA: Diagnosis not present

## 2023-07-10 DIAGNOSIS — M1612 Unilateral primary osteoarthritis, left hip: Secondary | ICD-10-CM | POA: Diagnosis not present

## 2023-07-29 DIAGNOSIS — M1612 Unilateral primary osteoarthritis, left hip: Secondary | ICD-10-CM | POA: Diagnosis not present

## 2023-08-09 DIAGNOSIS — M1612 Unilateral primary osteoarthritis, left hip: Secondary | ICD-10-CM | POA: Diagnosis not present

## 2023-08-13 DIAGNOSIS — Z111 Encounter for screening for respiratory tuberculosis: Secondary | ICD-10-CM | POA: Diagnosis not present

## 2023-08-20 DIAGNOSIS — M1612 Unilateral primary osteoarthritis, left hip: Secondary | ICD-10-CM | POA: Diagnosis not present

## 2023-10-11 DIAGNOSIS — K219 Gastro-esophageal reflux disease without esophagitis: Secondary | ICD-10-CM | POA: Diagnosis not present

## 2023-10-11 DIAGNOSIS — J45909 Unspecified asthma, uncomplicated: Secondary | ICD-10-CM | POA: Diagnosis not present

## 2023-10-11 DIAGNOSIS — Z833 Family history of diabetes mellitus: Secondary | ICD-10-CM | POA: Diagnosis not present

## 2023-10-11 DIAGNOSIS — Z8249 Family history of ischemic heart disease and other diseases of the circulatory system: Secondary | ICD-10-CM | POA: Diagnosis not present

## 2023-10-11 DIAGNOSIS — I10 Essential (primary) hypertension: Secondary | ICD-10-CM | POA: Diagnosis not present

## 2023-10-11 DIAGNOSIS — Z96649 Presence of unspecified artificial hip joint: Secondary | ICD-10-CM | POA: Diagnosis not present

## 2023-10-11 DIAGNOSIS — Z7951 Long term (current) use of inhaled steroids: Secondary | ICD-10-CM | POA: Diagnosis not present

## 2024-02-04 DIAGNOSIS — Z0000 Encounter for general adult medical examination without abnormal findings: Secondary | ICD-10-CM | POA: Diagnosis not present

## 2024-02-04 DIAGNOSIS — Z7189 Other specified counseling: Secondary | ICD-10-CM | POA: Diagnosis not present

## 2024-02-06 DIAGNOSIS — Z1331 Encounter for screening for depression: Secondary | ICD-10-CM | POA: Diagnosis not present

## 2024-02-06 DIAGNOSIS — Z1231 Encounter for screening mammogram for malignant neoplasm of breast: Secondary | ICD-10-CM | POA: Diagnosis not present

## 2024-02-06 DIAGNOSIS — Z01411 Encounter for gynecological examination (general) (routine) with abnormal findings: Secondary | ICD-10-CM | POA: Diagnosis not present

## 2024-02-06 DIAGNOSIS — N952 Postmenopausal atrophic vaginitis: Secondary | ICD-10-CM | POA: Diagnosis not present

## 2024-02-06 DIAGNOSIS — Z23 Encounter for immunization: Secondary | ICD-10-CM | POA: Diagnosis not present

## 2024-02-12 DIAGNOSIS — M1612 Unilateral primary osteoarthritis, left hip: Secondary | ICD-10-CM | POA: Diagnosis not present

## 2024-04-01 DIAGNOSIS — Z23 Encounter for immunization: Secondary | ICD-10-CM | POA: Diagnosis not present

## 2024-05-19 DIAGNOSIS — K5730 Diverticulosis of large intestine without perforation or abscess without bleeding: Secondary | ICD-10-CM | POA: Diagnosis not present

## 2024-05-19 DIAGNOSIS — Z9889 Other specified postprocedural states: Secondary | ICD-10-CM | POA: Diagnosis not present

## 2024-05-19 DIAGNOSIS — Z09 Encounter for follow-up examination after completed treatment for conditions other than malignant neoplasm: Secondary | ICD-10-CM | POA: Diagnosis not present

## 2024-05-19 DIAGNOSIS — Z860101 Personal history of adenomatous and serrated colon polyps: Secondary | ICD-10-CM | POA: Diagnosis not present

## 2024-05-21 DIAGNOSIS — M47816 Spondylosis without myelopathy or radiculopathy, lumbar region: Secondary | ICD-10-CM | POA: Diagnosis not present
